# Patient Record
Sex: Male | Born: 1955 | Race: White | Hispanic: No | Marital: Married | Smoking: Never smoker
Health system: Southern US, Community
[De-identification: ages and names within clinical notes are randomized; demographics above are authoritative.]

## PROBLEM LIST (undated history)

## (undated) DIAGNOSIS — Z87442 Personal history of urinary calculi: Secondary | ICD-10-CM

## (undated) DIAGNOSIS — Z95828 Presence of other vascular implants and grafts: Secondary | ICD-10-CM

## (undated) DIAGNOSIS — Z9221 Personal history of antineoplastic chemotherapy: Secondary | ICD-10-CM

## (undated) DIAGNOSIS — C801 Malignant (primary) neoplasm, unspecified: Secondary | ICD-10-CM

## (undated) DIAGNOSIS — Z9289 Personal history of other medical treatment: Secondary | ICD-10-CM

## (undated) DIAGNOSIS — J189 Pneumonia, unspecified organism: Secondary | ICD-10-CM

## (undated) DIAGNOSIS — I499 Cardiac arrhythmia, unspecified: Secondary | ICD-10-CM

## (undated) DIAGNOSIS — G629 Polyneuropathy, unspecified: Secondary | ICD-10-CM

## (undated) DIAGNOSIS — Z8719 Personal history of other diseases of the digestive system: Secondary | ICD-10-CM

## (undated) HISTORY — PX: OTHER SURGICAL HISTORY: SHX169

## (undated) HISTORY — PX: APPENDECTOMY: SHX54

---

## 2017-08-16 ENCOUNTER — Other Ambulatory Visit: Payer: Self-pay | Admitting: Oncology

## 2017-08-16 DIAGNOSIS — C787 Secondary malignant neoplasm of liver and intrahepatic bile duct: Secondary | ICD-10-CM

## 2017-08-23 ENCOUNTER — Ambulatory Visit
Admission: RE | Admit: 2017-08-23 | Discharge: 2017-08-23 | Disposition: A | Discharge status: Home / Self Care | Payer: BLUE CROSS/BLUE SHIELD | Source: Ambulatory Visit | Attending: Oncology | Admitting: Oncology

## 2017-08-23 DIAGNOSIS — C787 Secondary malignant neoplasm of liver and intrahepatic bile duct: Secondary | ICD-10-CM

## 2017-08-23 HISTORY — PX: IR RADIOLOGIST EVAL & MGMT: IMG5224

## 2017-08-23 NOTE — Consult Note (Signed)
Chief Complaint: I have colon cancer  Referring Physician(s): Khan,Kalsoom PCP: Dr. Concepcion Elk  History of Present Illness: Andres Chang is a 61 y.o. male presenting today as a scheduled consultation to Vascular & Interventional Radiology, kindly referred by Dr. Humphrey Rolls, for consideration of liver directed therapy for treatment of known metastatic colorectal carcinoma.    Andres Chang tells me that his diagnosis was made in September of 2016, when he presented to [facility] with rectal bleeding.  In fact, he was referred to VIR at that time, and I helped to perform a mesenteric angiogram with embolization for his emergent, life-threatening hemorrhage.  As it was, he had tumor bleeding, with the diagnosis made thereafter.    He has not had surgical resection.  He has been treated with chemotherapy, initiating his therapy in 2016.    I do not have current access to all of his electronic medical records (recent electronic merger with the new [facility]), but I do have Dr. Laurelyn Sickle recent clinic notes from 08/10/2017.    Dr. Laurelyn Sickle plan is to continue him on chemotherapy of 5-FU/leucovorin/Avastin.   Cycle 38 was initiated 08/10/2017.    PET-CT has been performed first 09/16/2015, then 01/20/2017, showing marked response of the liver disease burden, decreased rectal mass, decreased activity of adjacent nodal disease.   The most recent PET-CT 08/02/2017 shows only 2 residual foci of the liver, both measuring only ~2cm, with no residual disease on the rectum, and no thoracic disease.     I discussed the imaging with Dr. Humphrey Rolls prior to Andres Chang's referral, and discussion regarding possible ablation is reasonable.    Andres Chang tells me that right now he feels fine.  He says that he will have some fatigue a week or so after each of his treatments, but he denies any significant N/V/D.  He is able to do everything he needs to during the day.  His treatment with oxaliplatin resulted in some loss  of sensation of both hands, and he is no longer on this therapy.   He tells me he gets up in the am every day, and although does not spend a lot of time out of door to avoid the sun, he is out of the bed all day around the house he has a good appetite.  I feel that he had ECOG now of 0.    He does have a history of ascites, with several prior paracentesis.  Imaging shows evidence of cirrhosis, with esophageal varices as well.  He has no knowledge of HBV, HCV, NASH, and denies any history of heavy ETOH use.  He has been previously treated with amiodarone, now held.    His last paracentesis is January 2018.    Med Hx: Anemia Arrhythmia/Afib HTN Nephrolithiasis Thrombocytopenia  Social hx: Married  Allergies: Cymbalta [duloxetine hcl]; Gabapentin; and Penicillins  Medications: Prior to Admission medications   Medication Sig Start Date End Date Taking? Authorizing Provider  ALPRAZolam Duanne Moron) 1 MG tablet Take 1 mg by mouth daily as needed for anxiety.    Yes [provider]  B COMPLEX VITAMINS ER PO Take 1 tablet by mouth daily.   Yes [provider]  diltiazem (DILACOR XR) 120 MG 24 hr capsule Take 120 mg by mouth daily.   Yes [provider]  Iron-FA-B Cmp-C-Biot-Probiotic (FUSION PLUS) CAPS Take 1 tablet by mouth daily.   Yes [provider]  megestrol (MEGACE) 400 MG/10ML suspension Take 800 mg by  mouth daily.   Yes [provider]  prochlorperazine (COMPAZINE) 10 MG tablet Take 10 mg by mouth daily.   Yes [provider]  rivaroxaban (XARELTO) 20 MG TABS tablet Take 20 mg by mouth daily with supper.   Yes [provider]  spironolactone (ALDACTONE) 50 MG tablet Take 50 mg by mouth daily.   Yes [provider]     No family history on file.  Social History   Social History  . Marital status: Married    Spouse name: N/A  . Number of children: N/A  . Years of education: N/A   Social History Main Topics    . Smoking status: Not on file  . Smokeless tobacco: Not on file  . Alcohol use Not on file  . Drug use: Unknown  . Sexual activity: Not on file   Other Topics Concern  . Not on file   Social History Narrative  . No narrative on file    ECOG Status: 0 - Asymptomatic  Review of Systems: A 12 point ROS discussed and pertinent positives are indicated in the HPI above.  All other systems are negative.  Review of Systems  Vital Signs: BP 125/73   Pulse 61   Temp 98.1 F (36.7 C) (Oral)   Resp 14   Ht 5\' 10"  (1.778 m)   Wt 206 lb (93.4 kg)   SpO2 97%   BMI 29.56 kg/m   Physical Exam General: 61 yo male appearing  stated age.  Well-developed, well-nourished.  No distress. HEENT: Atraumatic, normocephalic.  Conjugate gaze, extra-ocular motor intact. No scleral icterus or scleral injection. No lesions on external ears, nose, lips, or gums.  Oral mucosa moist, pink.  Neck: Symmetric with no goiter enlargement.  Chest/Lungs:  Symmetric chest with inspiration/expiration.  No labored breathing.     Heart:  RRR, with no third heart sounds appreciated. No JVD appreciated.  Abdomen:  Soft, NT/ND, with + bowel sounds.   Genito-urinary: Deferred Neurologic: Alert & Oriented to person, place, and time.   Normal affect and insight.  Appropriate questions.  Moving all 4 extremities       Mallampati Score:  2  Imaging: No results found.  Labs:  CBC: No results for input(s): WBC, HGB, HCT, PLT in the last 8760 hours.  COAGS: No results for input(s): INR, APTT in the last 8760 hours.  BMP: No results for input(s): NA, K, CL, CO2, GLUCOSE, BUN, CALCIUM, CREATININE, GFRNONAA, GFRAA in the last 8760 hours.  Invalid input(s): CMP  LIVER FUNCTION TESTS: No results for input(s): BILITOT, AST, ALT, ALKPHOS, PROT, ALBUMIN in the last 8760 hours.  TUMOR MARKERS: No results for input(s): AFPTM, CEA, CA199, CHROMGRNA in the last 8760 hours.  Assessment and Plan:  Andres Chang is a 61  year old male with known metastatic colorectal carcinoma, treated without surgical resection, completing 38 cycles of chemotherapy.    He has recent PET CT imaging showing no residual disease of the rectum (primary), no thoracic disease, and no adenopathy.  He only has 2 small residual lesions in the liver, thus oligometastatic disease.  I have discussed his imaging with Dr. Humphrey Rolls, and given the small size, I agree that he is a candidate for possible ablation.  I suspect with his history of ascites he would not be a candidate for surgical resection.    I have reviewed all of the imaging with the patient and his wife, and I had a long discussion regarding the role of  liver directed therapy as a palliative therapy.  Technical success with image-guided ablation is quite high in the setting of small lesions such as his (<4cm), although both are at the dome and near the diaphragm.    I had a thorough discussion regarding the risks/benefits, with specific risks including bleeding, infection, adjacent injury including diaphragm, lung, chest wall, biloma, need for further surgery/procedure including chest tube placement, cardiopulmonary collapse, death.  I answered all of his questions, including post-operative care and recovery time.    He would like to proceed with therapy.    Plan: - Plan for CT guided ablation, microwave ablation, 2 lesion of right liver.  General Anesthesia, with overnight observation.  - We will need to hold any anti-coagulation before his procedure.  - I have advised him to observe his follow up appointments with his physicians.   Electronically Signed: Corrie Chang 08/23/2017, 5:37 PM   I spent a total of  40 Minutes   in face to face in clinical consultation, greater than 50% of which was counseling/coordinating care for metastatic colorectal carcinoma, possible CT guided ablation of liver lesions (2).

## 2017-08-30 ENCOUNTER — Other Ambulatory Visit (HOSPITAL_COMMUNITY): Payer: Self-pay | Admitting: Interventional Radiology

## 2017-08-30 DIAGNOSIS — C787 Secondary malignant neoplasm of liver and intrahepatic bile duct: Secondary | ICD-10-CM

## 2017-09-12 ENCOUNTER — Other Ambulatory Visit: Payer: Self-pay | Admitting: Radiology

## 2017-09-12 NOTE — Patient Instructions (Addendum)
Andres Chang  09/12/2017   Your procedure is scheduled on: 09/16/2017    Report to [facility] Main  Entrance and Report to Radiology at 0630am then  Take [facility]  elevators to 3rd floor to  [place] at   [place] AM.    Call this number if you have problems the morning of surgery [phone]    Remember: ONLY 1 PERSON MAY GO WITH YOU TO SHORT STAY TO GET  READY MORNING OF [place].  Do not eat food or drink liquids :After Midnight.     Take these medicines the morning of surgery with A SIP OF WATER: Cardizem                                 You may not have any metal on your body including hair pins and              piercings  Do not wear jewelry,  lotions, powders or perfumes, deodorant            .              Men may shave face and neck.   Do not bring valuables to the hospital. [place].  Contacts, dentures or bridgework may not be worn into surgery.  Leave suitcase in the car. After surgery it may be brought to your room.                       Please read over the following fact sheets you were given: _____________________________________________________________________             [facility] - Preparing for Surgery Before surgery, you can play an important role.  Because skin is not sterile, your skin needs to be as free of germs as possible.  You can reduce the number of germs on your skin by washing with CHG (chlorahexidine gluconate) soap before surgery.  CHG is an antiseptic cleaner which kills germs and bonds with the skin to continue killing germs even after washing. Please DO NOT use if you have an allergy to CHG or antibacterial soaps.  If your skin becomes reddened/irritated stop using the CHG and inform your nurse when you arrive at Short Stay. Do not shave (including legs and underarms) for at least 48 hours prior to the first CHG shower.  You may shave your face/neck. Please  follow these instructions carefully:  1.  Shower with CHG Soap the night before surgery and the  morning of Surgery.  2.  If you choose to wash your hair, wash your hair first as usual with your  normal  shampoo.  3.  After you shampoo, rinse your hair and body thoroughly to remove the  shampoo.                           4.  Use CHG as you would any other liquid soap.  You can apply chg directly  to the skin and wash                       Gently with a scrungie or clean washcloth.  5.  Apply the CHG Soap to your body ONLY FROM THE NECK DOWN.   Do not use on face/ open                           Wound or open sores. Avoid contact with eyes, ears mouth and genitals (private parts).                       Wash face,  Genitals (private parts) with your normal soap.             6.  Wash thoroughly, paying special attention to the area where your surgery  will be performed.  7.  Thoroughly rinse your body with warm water from the neck down.  8.  DO NOT shower/wash with your normal soap after using and rinsing off  the CHG Soap.                9.  Pat yourself dry with a clean towel.            10.  Wear clean pajamas.            11.  Place clean sheets on your bed the night of your first shower and do not  sleep with pets. Day of Surgery : Do not apply any lotions/deodorants the morning of surgery.  Please wear clean clothes to the hospital/surgery center.  FAILURE TO FOLLOW THESE INSTRUCTIONS MAY RESULT IN THE CANCELLATION OF YOUR SURGERY PATIENT SIGNATURE_________________________________  NURSE SIGNATURE__________________________________  ________________________________________________________________________

## 2017-09-13 ENCOUNTER — Ambulatory Visit (HOSPITAL_COMMUNITY)
Admission: RE | Admit: 2017-09-13 | Discharge: 2017-09-13 | Disposition: A | Discharge status: Home / Self Care | Payer: BLUE CROSS/BLUE SHIELD | Source: Ambulatory Visit | Attending: Radiology | Admitting: Radiology

## 2017-09-13 ENCOUNTER — Other Ambulatory Visit: Payer: Self-pay | Admitting: Radiology

## 2017-09-13 ENCOUNTER — Encounter (HOSPITAL_COMMUNITY): Payer: Self-pay

## 2017-09-13 ENCOUNTER — Encounter (HOSPITAL_COMMUNITY)
Admission: RE | Admit: 2017-09-13 | Discharge: 2017-09-13 | Disposition: A | Discharge status: Home / Self Care | Payer: BLUE CROSS/BLUE SHIELD | Source: Ambulatory Visit | Attending: Interventional Radiology | Admitting: Interventional Radiology

## 2017-09-13 DIAGNOSIS — Z01818 Encounter for other preprocedural examination: Secondary | ICD-10-CM | POA: Diagnosis not present

## 2017-09-13 DIAGNOSIS — C189 Malignant neoplasm of colon, unspecified: Secondary | ICD-10-CM | POA: Insufficient documentation

## 2017-09-13 DIAGNOSIS — C787 Secondary malignant neoplasm of liver and intrahepatic bile duct: Secondary | ICD-10-CM | POA: Diagnosis not present

## 2017-09-13 DIAGNOSIS — Z0183 Encounter for blood typing: Secondary | ICD-10-CM | POA: Insufficient documentation

## 2017-09-13 DIAGNOSIS — I491 Atrial premature depolarization: Secondary | ICD-10-CM | POA: Diagnosis not present

## 2017-09-13 DIAGNOSIS — Z01812 Encounter for preprocedural laboratory examination: Secondary | ICD-10-CM | POA: Insufficient documentation

## 2017-09-13 HISTORY — DX: Cardiac arrhythmia, unspecified: I49.9

## 2017-09-13 HISTORY — DX: Pneumonia, unspecified organism: J18.9

## 2017-09-13 HISTORY — DX: Personal history of antineoplastic chemotherapy: Z92.21

## 2017-09-13 HISTORY — DX: Personal history of urinary calculi: Z87.442

## 2017-09-13 HISTORY — DX: Presence of other vascular implants and grafts: Z95.828

## 2017-09-13 HISTORY — DX: Personal history of other diseases of the digestive system: Z87.19

## 2017-09-13 HISTORY — DX: Polyneuropathy, unspecified: G62.9

## 2017-09-13 HISTORY — DX: Personal history of other medical treatment: Z92.89

## 2017-09-13 HISTORY — DX: Malignant (primary) neoplasm, unspecified: C80.1

## 2017-09-13 LAB — CBC WITH DIFFERENTIAL/PLATELET
Basophils Absolute: 0 10*3/uL (ref 0.0–0.1)
Basophils Relative: 0 %
EOS PCT: 6 %
Eosinophils Absolute: 0.4 10*3/uL (ref 0.0–0.7)
HCT: 39.6 % (ref 39.0–52.0)
HEMOGLOBIN: 13.7 g/dL (ref 13.0–17.0)
Lymphocytes Relative: 13 %
Lymphs Abs: 0.9 10*3/uL (ref 0.7–4.0)
MCH: 32.7 pg (ref 26.0–34.0)
MCHC: 34.6 g/dL (ref 30.0–36.0)
MCV: 94.5 fL (ref 78.0–100.0)
Monocytes Absolute: 1 10*3/uL (ref 0.1–1.0)
Monocytes Relative: 15 %
NEUTROS PCT: 66 %
Neutro Abs: 4.4 10*3/uL (ref 1.7–7.7)
Platelets: 72 10*3/uL — ABNORMAL LOW (ref 150–400)
RBC: 4.19 MIL/uL — AB (ref 4.22–5.81)
RDW: 15.9 % — ABNORMAL HIGH (ref 11.5–15.5)
WBC: 6.7 10*3/uL (ref 4.0–10.5)

## 2017-09-13 LAB — COMPREHENSIVE METABOLIC PANEL
ALT: 39 U/L (ref 17–63)
AST: 50 U/L — AB (ref 15–41)
Albumin: 3.6 g/dL (ref 3.5–5.0)
Alkaline Phosphatase: 72 U/L (ref 38–126)
Anion gap: 8 (ref 5–15)
BILIRUBIN TOTAL: 1.5 mg/dL — AB (ref 0.3–1.2)
BUN: 11 mg/dL (ref 6–20)
CHLORIDE: 109 mmol/L (ref 101–111)
CO2: 24 mmol/L (ref 22–32)
CREATININE: 1.01 mg/dL (ref 0.61–1.24)
Calcium: 9.7 mg/dL (ref 8.9–10.3)
GFR calc Af Amer: >60 mL/min (ref >60)
Glucose, Bld: 103 mg/dL — ABNORMAL HIGH (ref 65–99)
Potassium: 4.1 mmol/L (ref 3.5–5.1)
Sodium: 141 mmol/L (ref 135–145)
TOTAL PROTEIN: 6.3 g/dL — AB (ref 6.5–8.1)

## 2017-09-13 LAB — PROTIME-INR
INR: 1.68
PROTHROMBIN TIME: 19.7 s — AB (ref 11.4–15.2)

## 2017-09-13 NOTE — Progress Notes (Signed)
CBC done 09/13/2017 routed via epic to Dr Earleen Newport.

## 2017-09-13 NOTE — Progress Notes (Signed)
AT time of preop appt patient and wife thought last dose of Manus Rudd would be on 09/14/17.  Both were going home to check to make sure they had the preop instructions regarding Xarelto. Wife called back to state they had the preop instructions and the last dose of Xarelto is on 09/14/17 per instructions.

## 2017-09-13 NOTE — Progress Notes (Signed)
Spoke with Rowe Robert PA and made him aware of platelet count of 72 on CBC result of 09/13/17 and patient's last dose of Xarelton on 09/14/17 per patient and that Anesthesia Dr Fransisco Beau had seen him today on preop appt.

## 2017-09-13 NOTE — Progress Notes (Signed)
Requested most recent office visit note, ekg , stress and echo from Dr Minna Merritts office in [place].  They are to fax.

## 2017-09-13 NOTE — Progress Notes (Signed)
PT/INR done 09/13/17 routed via epic to dr Earleen Newport.

## 2017-09-13 NOTE — Progress Notes (Signed)
Anesthesia consult completed by Dr Fransisco Beau .

## 2017-09-14 LAB — ABO/RH: ABO/RH(D): A POS

## 2017-09-14 NOTE — Progress Notes (Signed)
Received and placed on chart from office of Dr Minna Merritts- office visit note of 05/17/17 Seen by PA- Chesley Mires and ECHO from 01/14/2017 from [facility] placed on chart.

## 2017-09-14 NOTE — Progress Notes (Signed)
Reerquested LOV, ekg , stress and echo from office of Dr Minna Merritts.  They are to send.  Rerequested on 09/14/17.

## 2017-09-14 NOTE — Progress Notes (Signed)
Final EKG done 09/13/17-epic.

## 2017-09-15 ENCOUNTER — Other Ambulatory Visit: Payer: Self-pay | Admitting: Radiology

## 2017-09-16 ENCOUNTER — Ambulatory Visit (HOSPITAL_COMMUNITY): Payer: BLUE CROSS/BLUE SHIELD | Admitting: Anesthesiology

## 2017-09-16 ENCOUNTER — Ambulatory Visit (HOSPITAL_COMMUNITY)
Admission: RE | Admit: 2017-09-16 | Discharge: 2017-09-16 | Disposition: A | Discharge status: Home / Self Care | Payer: BLUE CROSS/BLUE SHIELD | Source: Ambulatory Visit | Attending: Interventional Radiology | Admitting: Interventional Radiology

## 2017-09-16 ENCOUNTER — Encounter (HOSPITAL_COMMUNITY): Payer: Self-pay

## 2017-09-16 ENCOUNTER — Encounter (HOSPITAL_COMMUNITY)
Admission: RE | Disposition: A | Discharge status: Home / Self Care | Payer: Self-pay | Source: Ambulatory Visit | Attending: Interventional Radiology

## 2017-09-16 ENCOUNTER — Observation Stay (HOSPITAL_COMMUNITY)
Admission: RE | Admit: 2017-09-16 | Discharge: 2017-09-17 | Disposition: A | Discharge status: Home / Self Care | Payer: BLUE CROSS/BLUE SHIELD | Source: Ambulatory Visit | Attending: Interventional Radiology | Admitting: Interventional Radiology

## 2017-09-16 ENCOUNTER — Encounter (HOSPITAL_COMMUNITY): Payer: Self-pay | Admitting: Anesthesiology

## 2017-09-16 ENCOUNTER — Other Ambulatory Visit: Payer: Self-pay

## 2017-09-16 DIAGNOSIS — C787 Secondary malignant neoplasm of liver and intrahepatic bile duct: Secondary | ICD-10-CM

## 2017-09-16 DIAGNOSIS — Z79899 Other long term (current) drug therapy: Secondary | ICD-10-CM | POA: Diagnosis not present

## 2017-09-16 DIAGNOSIS — Z89022 Acquired absence of left finger(s): Secondary | ICD-10-CM | POA: Insufficient documentation

## 2017-09-16 DIAGNOSIS — Z7901 Long term (current) use of anticoagulants: Secondary | ICD-10-CM | POA: Insufficient documentation

## 2017-09-16 DIAGNOSIS — Z9221 Personal history of antineoplastic chemotherapy: Secondary | ICD-10-CM | POA: Insufficient documentation

## 2017-09-16 DIAGNOSIS — C189 Malignant neoplasm of colon, unspecified: Secondary | ICD-10-CM | POA: Diagnosis present

## 2017-09-16 DIAGNOSIS — K7460 Unspecified cirrhosis of liver: Secondary | ICD-10-CM | POA: Diagnosis not present

## 2017-09-16 DIAGNOSIS — I4891 Unspecified atrial fibrillation: Secondary | ICD-10-CM | POA: Insufficient documentation

## 2017-09-16 DIAGNOSIS — I10 Essential (primary) hypertension: Secondary | ICD-10-CM | POA: Insufficient documentation

## 2017-09-16 HISTORY — PX: RADIOLOGY WITH ANESTHESIA: SHX6223

## 2017-09-16 LAB — CBC WITH DIFFERENTIAL/PLATELET
Basophils Absolute: 0 10*3/uL (ref 0.0–0.1)
Basophils Relative: 0 %
EOS ABS: 0.5 10*3/uL (ref 0.0–0.7)
EOS PCT: 7 %
HCT: 39.6 % (ref 39.0–52.0)
Hemoglobin: 13.7 g/dL (ref 13.0–17.0)
LYMPHS ABS: 1.1 10*3/uL (ref 0.7–4.0)
Lymphocytes Relative: 16 %
MCH: 33.3 pg (ref 26.0–34.0)
MCHC: 34.6 g/dL (ref 30.0–36.0)
MCV: 96.4 fL (ref 78.0–100.0)
MONO ABS: 0.9 10*3/uL (ref 0.1–1.0)
MONOS PCT: 14 %
Neutro Abs: 4.2 10*3/uL (ref 1.7–7.7)
Neutrophils Relative %: 63 %
PLATELETS: 69 10*3/uL — AB (ref 150–400)
RBC: 4.11 MIL/uL — ABNORMAL LOW (ref 4.22–5.81)
RDW: 16.2 % — ABNORMAL HIGH (ref 11.5–15.5)
WBC: 6.7 10*3/uL (ref 4.0–10.5)

## 2017-09-16 LAB — COMPREHENSIVE METABOLIC PANEL
ALK PHOS: 71 U/L (ref 38–126)
ALT: 41 U/L (ref 17–63)
AST: 47 U/L — ABNORMAL HIGH (ref 15–41)
Albumin: 3.5 g/dL (ref 3.5–5.0)
Anion gap: 9 (ref 5–15)
BUN: 12 mg/dL (ref 6–20)
CALCIUM: 9.3 mg/dL (ref 8.9–10.3)
CHLORIDE: 107 mmol/L (ref 101–111)
CO2: 22 mmol/L (ref 22–32)
Creatinine, Ser: 0.93 mg/dL (ref 0.61–1.24)
GFR calc non Af Amer: >60 mL/min (ref >60)
Glucose, Bld: 101 mg/dL — ABNORMAL HIGH (ref 65–99)
Potassium: 3.8 mmol/L (ref 3.5–5.1)
SODIUM: 138 mmol/L (ref 135–145)
Total Bilirubin: 1.7 mg/dL — ABNORMAL HIGH (ref 0.3–1.2)
Total Protein: 6.2 g/dL — ABNORMAL LOW (ref 6.5–8.1)

## 2017-09-16 LAB — PROTIME-INR
INR: 1.11
Prothrombin Time: 14.2 seconds (ref 11.4–15.2)

## 2017-09-16 SURGERY — RADIOLOGY WITH ANESTHESIA
Anesthesia: General

## 2017-09-16 MED ORDER — IOPAMIDOL (ISOVUE-300) INJECTION 61%
75.0000 mL | Freq: Once | INTRAVENOUS | Status: AC | PRN
  Administered 2017-09-16: 75 mL via INTRAVENOUS

## 2017-09-16 MED ORDER — FENTANYL CITRATE (PF) 250 MCG/5ML IJ SOLN
INTRAMUSCULAR | Status: AC
  Filled 2017-09-16: qty 5

## 2017-09-16 MED ORDER — PROPOFOL 10 MG/ML IV BOLUS
INTRAVENOUS | Status: AC
  Filled 2017-09-16: qty 40

## 2017-09-16 MED ORDER — MEGESTROL ACETATE 400 MG/10ML PO SUSP: 800.0000 mg | Freq: Every day | ORAL | Status: DC | PRN

## 2017-09-16 MED ORDER — FENTANYL CITRATE (PF) 100 MCG/2ML IJ SOLN
INTRAMUSCULAR | Status: DC | PRN
  Administered 2017-09-16 (×3): 50 ug via INTRAVENOUS
  Administered 2017-09-16: 100 ug via INTRAVENOUS
  Administered 2017-09-16 (×2): 50 ug via INTRAVENOUS

## 2017-09-16 MED ORDER — SENNOSIDES-DOCUSATE SODIUM 8.6-50 MG PO TABS: 1.0000 | ORAL_TABLET | Freq: Every day | ORAL | Status: DC | PRN

## 2017-09-16 MED ORDER — DOCUSATE SODIUM 100 MG PO CAPS
100.0000 mg | ORAL_CAPSULE | Freq: Two times a day (BID) | ORAL | Status: DC
  Administered 2017-09-16 – 2017-09-17 (×2): 100 mg via ORAL
  Filled 2017-09-16 (×3): qty 1

## 2017-09-16 MED ORDER — RIVAROXABAN 20 MG PO TABS
20.0000 mg | ORAL_TABLET | Freq: Every day | ORAL | Status: DC
  Administered 2017-09-17: 20 mg via ORAL
  Filled 2017-09-16: qty 1

## 2017-09-16 MED ORDER — MIDAZOLAM HCL 2 MG/2ML IJ SOLN
INTRAMUSCULAR | Status: AC
  Filled 2017-09-16: qty 2

## 2017-09-16 MED ORDER — METRONIDAZOLE IN NACL 5-0.79 MG/ML-% IV SOLN
500.0000 mg | Freq: Once | INTRAVENOUS | Status: AC
  Administered 2017-09-16: 500 mg via INTRAVENOUS
  Filled 2017-09-16: qty 100

## 2017-09-16 MED ORDER — FENTANYL CITRATE (PF) 100 MCG/2ML IJ SOLN: 25.0000 ug | INTRAMUSCULAR | Status: DC | PRN

## 2017-09-16 MED ORDER — ONDANSETRON HCL 4 MG/2ML IJ SOLN
INTRAMUSCULAR | Status: AC
  Filled 2017-09-16: qty 2

## 2017-09-16 MED ORDER — DEXTROSE 5 % IV SOLN
2.0000 g | Freq: Once | INTRAVENOUS | Status: AC
  Administered 2017-09-16: 2 g via INTRAVENOUS
  Filled 2017-09-16: qty 2

## 2017-09-16 MED ORDER — ONDANSETRON HCL 4 MG/2ML IJ SOLN
INTRAMUSCULAR | Status: DC | PRN
  Administered 2017-09-16: 4 mg via INTRAVENOUS

## 2017-09-16 MED ORDER — DILTIAZEM HCL ER COATED BEADS 120 MG PO CP24
120.0000 mg | ORAL_CAPSULE | Freq: Every day | ORAL | Status: DC
  Administered 2017-09-17: 120 mg via ORAL
  Filled 2017-09-16: qty 1

## 2017-09-16 MED ORDER — SODIUM CHLORIDE 0.9 % IV SOLN
Freq: Once | INTRAVENOUS | Status: AC
  Administered 2017-09-16: 13:00:00 via INTRAVENOUS

## 2017-09-16 MED ORDER — IOPAMIDOL (ISOVUE-300) INJECTION 61%
INTRAVENOUS | Status: AC
  Administered 2017-09-16: 75 mL via INTRAVENOUS
  Filled 2017-09-16: qty 75

## 2017-09-16 MED ORDER — SODIUM CHLORIDE 0.9 % IV SOLN
INTRAVENOUS | Status: AC
  Filled 2017-09-16: qty 250

## 2017-09-16 MED ORDER — EPHEDRINE SULFATE-NACL 50-0.9 MG/10ML-% IV SOSY
PREFILLED_SYRINGE | INTRAVENOUS | Status: DC | PRN
  Administered 2017-09-16 (×2): 5 mg via INTRAVENOUS
  Administered 2017-09-16: 10 mg via INTRAVENOUS

## 2017-09-16 MED ORDER — MIDAZOLAM HCL 5 MG/5ML IJ SOLN
INTRAMUSCULAR | Status: DC | PRN
  Administered 2017-09-16: 2 mg via INTRAVENOUS

## 2017-09-16 MED ORDER — LACTATED RINGERS IV SOLN
INTRAVENOUS | Status: DC
  Administered 2017-09-16 (×3): via INTRAVENOUS

## 2017-09-16 MED ORDER — IOPAMIDOL (ISOVUE-300) INJECTION 61%
INTRAVENOUS | Status: AC
  Filled 2017-09-16: qty 30

## 2017-09-16 MED ORDER — FENTANYL CITRATE (PF) 100 MCG/2ML IJ SOLN
INTRAMUSCULAR | Status: AC
  Filled 2017-09-16: qty 2

## 2017-09-16 MED ORDER — DEXAMETHASONE SODIUM PHOSPHATE 10 MG/ML IJ SOLN
INTRAMUSCULAR | Status: AC
  Filled 2017-09-16: qty 1

## 2017-09-16 MED ORDER — SPIRONOLACTONE 25 MG PO TABS
50.0000 mg | ORAL_TABLET | Freq: Every day | ORAL | Status: DC
  Administered 2017-09-17: 50 mg via ORAL
  Filled 2017-09-16 (×2): qty 2

## 2017-09-16 MED ORDER — DEXAMETHASONE SODIUM PHOSPHATE 10 MG/ML IJ SOLN
INTRAMUSCULAR | Status: DC | PRN
  Administered 2017-09-16: 10 mg via INTRAVENOUS

## 2017-09-16 MED ORDER — LIDOCAINE 2% (20 MG/ML) 5 ML SYRINGE
INTRAMUSCULAR | Status: DC | PRN
  Administered 2017-09-16: 40 mg via INTRAVENOUS
  Administered 2017-09-16: 60 mg via INTRAVENOUS

## 2017-09-16 MED ORDER — HYDROCODONE-ACETAMINOPHEN 5-325 MG PO TABS: 1.0000 | ORAL_TABLET | ORAL | Status: DC | PRN

## 2017-09-16 MED ORDER — SODIUM CHLORIDE 0.9 % IV SOLN
INTRAVENOUS | Status: AC
  Administered 2017-09-16: 13:00:00 via INTRAVENOUS

## 2017-09-16 MED ORDER — PROPOFOL 10 MG/ML IV BOLUS
INTRAVENOUS | Status: DC | PRN
  Administered 2017-09-16: 170 mg via INTRAVENOUS

## 2017-09-16 MED ORDER — SUGAMMADEX SODIUM 200 MG/2ML IV SOLN
INTRAVENOUS | Status: DC | PRN
Start: 2017-09-16 — End: 2017-09-16
  Administered 2017-09-16: 200 mg via INTRAVENOUS

## 2017-09-16 MED ORDER — ONDANSETRON HCL 4 MG/2ML IJ SOLN: 4.0000 mg | Freq: Four times a day (QID) | INTRAMUSCULAR | Status: DC | PRN

## 2017-09-16 MED ORDER — ROCURONIUM BROMIDE 10 MG/ML (PF) SYRINGE
PREFILLED_SYRINGE | INTRAVENOUS | Status: DC | PRN
  Administered 2017-09-16: 50 mg via INTRAVENOUS

## 2017-09-16 MED ORDER — ALPRAZOLAM 0.5 MG PO TABS
0.5000 mg | ORAL_TABLET | Freq: Every day | ORAL | Status: DC
Start: 2017-09-16 — End: 2017-09-17
  Administered 2017-09-16: 0.5 mg via ORAL
  Filled 2017-09-16: qty 1

## 2017-09-16 NOTE — Transfer of Care (Signed)
Immediate Anesthesia Transfer of Care Note  Patient: Andres Chang  Procedure(s) Performed: Procedure(s): CT - MICROWAVE ABLATION LIVER (N/A)  Patient Location: PACU  Anesthesia Type:General  Level of Consciousness:  sedated, patient cooperative and responds to stimulation  Airway & Oxygen Therapy:Patient Spontanous Breathing and Patient connected to face mask oxgen  Post-op Assessment:  Report given to PACU RN and Post -op Vital signs reviewed and stable  Post vital signs:  Reviewed and stable  Last Vitals:  Vitals:   09/16/17 0654  BP: (!) 151/81  Pulse: 71  Resp: 16  Temp: 36.6 C  SpO2: 01%    Complications: No apparent anesthesia complications

## 2017-09-16 NOTE — Anesthesia Postprocedure Evaluation (Signed)
Anesthesia Post Note  Patient: Andres Chang  Procedure(s) Performed: Procedure(s) (LRB): CT - MICROWAVE ABLATION LIVER (N/A)     Patient location during evaluation: PACU Anesthesia Type: General Level of consciousness: awake and alert Pain management: pain level controlled Vital Signs Assessment: post-procedure vital signs reviewed and stable Respiratory status: spontaneous breathing, nonlabored ventilation and respiratory function stable Cardiovascular status: blood pressure returned to baseline and stable Postop Assessment: no apparent nausea or vomiting Anesthetic complications: no    Last Vitals:  Vitals:   09/16/17 1325 09/16/17 1330  BP: (!) (P) 144/81 136/73  Pulse: (P) 67 69  Resp: (P) 20 20  Temp: (!) 36.4 C   SpO2: (P) 100% 97%    Last Pain:  Vitals:   09/16/17 1245  TempSrc: Oral  PainSc:                  [name]

## 2017-09-16 NOTE — Anesthesia Preprocedure Evaluation (Addendum)
Anesthesia Evaluation  Patient identified by MRN, date of birth, ID band Patient awake    Reviewed: Allergy & Precautions, NPO status , Patient's Chart, lab work & pertinent test results  Airway Mallampati: II  TM Distance: >3 FB Neck ROM: Full    Dental  (+) Dental Advisory Given   Pulmonary neg pulmonary ROS,    breath sounds clear to auscultation       Cardiovascular + dysrhythmias Atrial Fibrillation  Rhythm:Regular Rate:Normal     Neuro/Psych negative neurological ROS     GI/Hepatic negative GI ROS, Colon ca mets to liver    Endo/Other  negative endocrine ROS  Renal/GU negative Renal ROS     Musculoskeletal   Abdominal   Peds  Hematology  (+) Blood dyscrasia (Thrombocytopenia), ,   Anesthesia Other Findings   Reproductive/Obstetrics                            Lab Results  Component Value Date   WBC 6.7 09/13/2017   HGB 13.7 09/13/2017   HCT 39.6 09/13/2017   MCV 94.5 09/13/2017   PLT 72 (L) 09/13/2017   Lab Results  Component Value Date   CREATININE 1.01 09/13/2017   BUN 11 09/13/2017   NA 141 09/13/2017   K 4.1 09/13/2017   CL 109 09/13/2017   CO2 24 09/13/2017    Anesthesia Physical Anesthesia Plan  ASA: III  Anesthesia Plan: General   Post-op Pain Management:    Induction: Intravenous  PONV Risk Score and Plan: 2 and Ondansetron, Dexamethasone and Treatment may vary due to age or medical condition  Airway Management Planned: Oral ETT  Additional Equipment:   Intra-op Plan:   Post-operative Plan: Extubation in OR  Informed Consent: I have reviewed the patients History and Physical, chart, labs and discussed the procedure including the risks, benefits and alternatives for the proposed anesthesia with the patient or authorized representative who has indicated his/her understanding and acceptance.   Dental advisory given  Plan Discussed with:  CRNA  Anesthesia Plan Comments:        Anesthesia Quick Evaluation

## 2017-09-16 NOTE — Progress Notes (Signed)
Patient ID: Andres Chang, male   DOB: 12-12-1956, 61 y.o.   MRN: [id number] Patient currently without significant complaints;wanting something to eat. Vital signs stable, afebrile Puncture sites right upper quadrant abdomen soft, clean, dry, not significantly tender; abdomen soft, positive bowel sounds; foley in place draining yellow urine A/P: metastatic colon cancer to the liver, status post CT-guided microwave ablation of 2 foci in the right hepatic lobe earlier today; for overnight observation; check a.m. labs, advance diet as tolerated;d/c foley after 6 hr bedrest period, ; follow-up in IR clinic with Dr. Earleen Newport in 3-4 weeks  Rowe Robert, [facility]

## 2017-09-16 NOTE — Procedures (Signed)
Interventional Radiology Procedure Note  Procedure:  Image guided microwave ablation of 2 metastatic foci colorectal carcinoma of the right liver.   2 x 17g 15cm PR probes Neuwave, 35KTGYBWL @ 89HTDSK  Complications: None  EBL: none  Specimen: none  Operator: Dr. Earleen Newport, VIR  Recommendations:  - to PACU now for recovery - Overnight admission for observation - advance diet as tolerated - Do not submerge for 7 days - Routine wound care - for local discomfort, ice pack may be useful - prn analgesics overnight - Foley DC at [place] will follow, anticipate DC in am when goals met  Signed,   Dulcy Fanny. Earleen Newport, DO

## 2017-09-16 NOTE — H&P (Signed)
Referring Physician(s): Khan,K  Supervising Physician: Corrie Mckusick  Patient Status:  [facility] OP TBA  Chief Complaint:  Metastatic colon cancer to liver  Subjective: Patient familiar to IR service from recent consultation with Dr. Earleen Newport on 08/23/17 to discuss treatment options for metastatic colon cancer to the liver as well as prior mesenteric arteriogram with embolization in 2016. He has had prior chemotherapy but no surgical resection. Past medical history also significant for cirrhosis, ascites, atrial fibrillation, hypertension, nephrolithiasis, anemia/thrombocytopenia. Prior imaging has revealed 2 lesions less than 2 cm in the right hepatic lobe.Following consultation the patient was deemed an appropriate candidate for CT-guided microwave ablation of the liver lesions and presents today for the procedure.he currently denies fever, headache, chest pain, dyspnea, cough, abdominal/back pain, nausea, vomiting or abnormal bleeding. Past Medical History:  Diagnosis Date  . Cancer ([place])    colon ca mets to liver   . Dysrhythmia    atrial fib   . History of blood transfusion   . History of chemotherapy   . History of GI bleed   . History of kidney stones   . Neuropathy   . Pneumonia    hx of   . Port catheter in place    Past Surgical History:  Procedure Laterality Date  . APPENDECTOMY    . kidney stent      due to kidney stones   . left fifth finger amputation     . port a cath placement    . surgery related to GI bleed         Allergies: Cymbalta [duloxetine hcl]; Gabapentin; and Penicillins  Medications: Prior to Admission medications   Medication Sig Start Date End Date Taking? Authorizing Provider  ALPRAZolam Duanne Moron) 1 MG tablet Take 0.5 mg by mouth at bedtime.    Yes [provider]  b complex vitamins tablet Take 1 tablet by mouth daily.   Yes [provider]  diltiazem (CARDIZEM CD) 120 MG 24 hr capsule Take 120 mg by mouth daily.   Yes  [provider]  Emollient (AQUAPHOR EX) Apply 1 application topically daily as needed (for bruised skin.).   Yes [provider]  Iron-FA-B Cmp-C-Biot-Probiotic (FUSION PLUS) CAPS Take 1 tablet by mouth daily.   Yes [provider]  prochlorperazine (COMPAZINE) 10 MG tablet Take 10 mg by mouth every 4 (four) hours as needed (for nausea/vomiting associated with chemo).    Yes [provider]  rivaroxaban (XARELTO) 20 MG TABS tablet Take 20 mg by mouth daily with breakfast.    Yes [provider]  spironolactone (ALDACTONE) 50 MG tablet Take 50 mg by mouth daily.   Yes [provider]  megestrol (MEGACE) 400 MG/10ML suspension Take 800 mg by mouth daily as needed (for appetite).     [provider]     Vital Signs: BP (!) 151/81   Pulse 71   Temp 97.8 F (36.6 C) (Oral)   Resp 16   SpO2 94%   Physical Exam: awake, alert. Chest clear to auscultation bilaterally. Clean, intact right chest wall Port-A-Cath. Heart with normal rate, irregular rhythm. Abdomen soft, positive bowel sounds, nontender. No lower extremity edema.  Imaging: Dg Chest 1 View  Result Date: 09/13/2017 CLINICAL DATA:  Preoperative evaluation for upcoming liver ablation EXAM: CHEST 1 VIEW COMPARISON:  None. FINDINGS: Cardiac shadow is within normal limits. The lungs are well aerated without focal infiltrate. Right chest wall port is noted in the mid superior vena cava. No  other focal abnormality is seen. IMPRESSION: No active disease. Electronically Signed   By: Inez Catalina M.D.   On: 09/13/2017 15:17    Labs:  CBC:  Recent Labs  09/13/17 1332 09/16/17 0706  WBC 6.7 6.7  HGB 13.7 13.7  HCT 39.6 39.6  PLT 72* 69*    COAGS:  Recent Labs  09/13/17 1332 09/16/17 0706  INR 1.68 1.11    BMP:  Recent Labs  09/13/17 1332 09/16/17 0706  NA 141 138  K 4.1 3.8  CL 109 107  CO2 24 22  GLUCOSE 103* 101*  BUN 11 12  CALCIUM 9.7 9.3    CREATININE 1.01 0.93  GFRNONAA >60 >60  GFRAA >60 >60    LIVER FUNCTION TESTS:  Recent Labs  09/13/17 1332 09/16/17 0706  BILITOT 1.5* 1.7*  AST 50* 47*  ALT 39 41  ALKPHOS 72 71  PROT 6.3* 6.2*  ALBUMIN 3.6 3.5    Assessment and Plan: Patient with history of metastatic colon cancer to the liver, initially diagnosed in 2016; status post chemotherapy. Prior imaging has revealed 2 right hepatic lobe lesions less than 2 cm. Patient seen in consultation by Dr. Earleen Newport on 08/23/17 and deemed an appropriate candidate for CT-guided microwave ablation of the liver lesions. He presents today for the procedure. Plan is to obtain follow-up CT scan today and if there has been no significant changes proceed with microwave ablation of the liver lesions.Details/risks of procedure, including but not limited to internal bleeding, infection, injury to adjacent structures including diaphragm, lung, chest wall, biloma, need for further surgery/procedure including chest tube placement, cardio pulmonary collapse and death discussed with patient and wife with their understanding and consent. Post procedure he will be admitted to the hospital for overnight observation.    Electronically Signed: D. Rowe Robert, PA-C 09/16/2017, 8:25 AM   I spent a total of 30 minutes at the the patient's bedside AND on the patient's hospital floor or unit, greater than 50% of which was counseling/coordinating care for CT-guided microwave ablation of hepatic lesions from metastatic colorectal cancer

## 2017-09-16 NOTE — Sedation Documentation (Signed)
Anesthesia in to sedate and monitor patient,

## 2017-09-16 NOTE — Anesthesia Procedure Notes (Signed)
Procedure Name: Intubation Date/Time: 09/16/2017 8:54 AM Performed by: Lyndle Pang, Virgel Gess Pre-anesthesia Checklist: Patient identified, Emergency Drugs available, Suction available, Patient being monitored and Timeout performed Patient Re-evaluated:Patient Re-evaluated prior to induction Oxygen Delivery Method: Circle system utilized Preoxygenation: Pre-oxygenation with 100% oxygen Induction Type: IV induction Ventilation: Mask ventilation without difficulty Laryngoscope Size: Mac and 4 Grade View: Grade I Tube type: Oral Tube size: 7.5 mm Number of attempts: 1 Airway Equipment and Method: Stylet Placement Confirmation: ETT inserted through vocal cords under direct vision,  positive ETCO2,  CO2 detector and breath sounds checked- equal and bilateral Secured at: 22 cm Tube secured with: Tape Dental Injury: Teeth and Oropharynx as per pre-operative assessment

## 2017-09-17 DIAGNOSIS — C189 Malignant neoplasm of colon, unspecified: Secondary | ICD-10-CM | POA: Diagnosis not present

## 2017-09-17 LAB — PREPARE PLATELET PHERESIS
UNIT DIVISION: 0
UNIT DIVISION: 0
UNIT DIVISION: 0

## 2017-09-17 LAB — CBC WITH DIFFERENTIAL/PLATELET
BASOS ABS: 0 10*3/uL (ref 0.0–0.1)
Basophils Relative: 0 %
Eosinophils Absolute: 0 10*3/uL (ref 0.0–0.7)
Eosinophils Relative: 0 %
HEMATOCRIT: 34.1 % — AB (ref 39.0–52.0)
HEMOGLOBIN: 11.6 g/dL — AB (ref 13.0–17.0)
LYMPHS PCT: 4 %
Lymphs Abs: 0.3 10*3/uL — ABNORMAL LOW (ref 0.7–4.0)
MCH: 33 pg (ref 26.0–34.0)
MCHC: 34 g/dL (ref 30.0–36.0)
MCV: 96.9 fL (ref 78.0–100.0)
Monocytes Absolute: 0.7 10*3/uL (ref 0.1–1.0)
Monocytes Relative: 11 %
NEUTROS ABS: 5.6 10*3/uL (ref 1.7–7.7)
NEUTROS PCT: 85 %
Platelets: 87 10*3/uL — ABNORMAL LOW (ref 150–400)
RBC: 3.52 MIL/uL — AB (ref 4.22–5.81)
RDW: 16.1 % — ABNORMAL HIGH (ref 11.5–15.5)
WBC: 6.6 10*3/uL (ref 4.0–10.5)

## 2017-09-17 LAB — BPAM PLATELET PHERESIS
BLOOD PRODUCT EXPIRATION DATE: 201809292359
BLOOD PRODUCT EXPIRATION DATE: 201809302359
Blood Product Expiration Date: 201809302359
ISSUE DATE / TIME: 201809281312
ISSUE DATE / TIME: 201809281215
ISSUE DATE / TIME: 201809281300
Unit Type and Rh: 6200
Unit Type and Rh: 6200
Unit Type and Rh: 6200

## 2017-09-17 LAB — COMPREHENSIVE METABOLIC PANEL
ALK PHOS: 65 U/L (ref 38–126)
ALT: 146 U/L — AB (ref 17–63)
AST: 211 U/L — AB (ref 15–41)
Albumin: 3.2 g/dL — ABNORMAL LOW (ref 3.5–5.0)
Anion gap: 8 (ref 5–15)
BILIRUBIN TOTAL: 1.9 mg/dL — AB (ref 0.3–1.2)
BUN: 14 mg/dL (ref 6–20)
CHLORIDE: 108 mmol/L (ref 101–111)
CO2: 22 mmol/L (ref 22–32)
CREATININE: 0.99 mg/dL (ref 0.61–1.24)
Calcium: 9.1 mg/dL (ref 8.9–10.3)
GFR calc Af Amer: >60 mL/min (ref >60)
Glucose, Bld: 221 mg/dL — ABNORMAL HIGH (ref 65–99)
Potassium: 4.3 mmol/L (ref 3.5–5.1)
Sodium: 138 mmol/L (ref 135–145)
Total Protein: 5.6 g/dL — ABNORMAL LOW (ref 6.5–8.1)

## 2017-09-17 MED ORDER — SENNOSIDES-DOCUSATE SODIUM 8.6-50 MG PO TABS: 1.0000 | ORAL_TABLET | Freq: Every day | ORAL | Status: DC | PRN

## 2017-09-17 MED ORDER — HYDROCODONE-ACETAMINOPHEN 5-325 MG PO TABS: 1.0000 | ORAL_TABLET | ORAL | 0 refills | Status: DC | PRN

## 2017-09-17 MED ORDER — DOCUSATE SODIUM 100 MG PO CAPS: 100.0000 mg | ORAL_CAPSULE | Freq: Two times a day (BID) | ORAL | 0 refills | Status: AC

## 2017-09-17 NOTE — Progress Notes (Signed)
Pt discharged to home with wife. Discharge education complete. No distress, concerns, questions noted.

## 2017-09-17 NOTE — Discharge Instructions (Signed)
You may resume you Xarelto today You may remove your dressing later today and shower.  Replace bandaid as needed

## 2017-09-17 NOTE — Discharge Summary (Signed)
Patient ID: Andres Chang MRN: [id number] DOB/AGE: 1956/02/08 61 y.o.  Admit date: 09/16/2017 Discharge date: 09/17/2017  Supervising Physician: Corrie Mckusick  Admission Diagnoses: metastatic colon cancer with liver mets  Discharge Diagnoses:  Active Problems:   Metastatic colon cancer to liver [facility])   Discharged Condition: good  Hospital Course: the patient was admitted for a MWA of liver mets from colon cancer.  He underwent the procedure and tolerated it very well.  He was mobilizing, tolerating a solid diet, and voiding well on his own on POD 1.  He had no pain and no other complaints.  He was stable for dc home.  He will resume his xarelto today as well.  Consults: None  Discharge Exam: Blood pressure 133/75, pulse 70, temperature 98.6 F (37 C), temperature source Oral, resp. rate 20, height 5\' 10"  (1.778 m), weight 219 lb (99.3 kg), SpO2 94 %. General appearance: alert and no distress Resp: clear to auscultation bilaterally Cardio: regular rate and rhythm GI: soft, non-tender; bowel sounds normal; RUQ stick site is c/d/i with no bleeding or evidence of infection  Disposition: Home   Allergies as of 09/17/2017      Reactions   Cymbalta [duloxetine Hcl] Other (See Comments)   Mental confusion.     Gabapentin Other (See Comments)   Lightheadedness; gait off balance     Penicillins Nausea And Vomiting, Other (See Comments)   ? Syncopal episode Has patient had a PCN reaction causing immediate rash, facial/tongue/throat swelling, SOB or lightheadedness with hypotension: Yes Has patient had a PCN reaction causing severe rash involving mucus membranes or skin necrosis: No Has patient had a PCN reaction that required hospitalization: No Has patient had a PCN reaction occurring within the last 10 years: No If all of the above answers are "NO", then may proceed with Cephalosporin use.      Medication List    TAKE these medications   ALPRAZolam 1 MG tablet Commonly known  as:  XANAX Take 0.5 mg by mouth at bedtime.   AQUAPHOR EX Apply 1 application topically daily as needed (for bruised skin.).   b complex vitamins tablet Take 1 tablet by mouth daily.   CARDIZEM CD 120 MG 24 hr capsule Generic drug:  diltiazem Take 120 mg by mouth daily.   docusate sodium 100 MG capsule Commonly known as:  COLACE Take 1 capsule (100 mg total) by mouth 2 (two) times daily.   FUSION PLUS Caps Take 1 tablet by mouth daily.   HYDROcodone-acetaminophen 5-325 MG tablet Commonly known as:  NORCO/VICODIN Take 1-2 tablets by mouth every 4 (four) hours as needed for moderate pain.   megestrol 400 MG/10ML suspension Commonly known as:  MEGACE Take 800 mg by mouth daily as needed (for appetite).   prochlorperazine 10 MG tablet Commonly known as:  COMPAZINE Take 10 mg by mouth every 4 (four) hours as needed (for nausea/vomiting associated with chemo).   rivaroxaban 20 MG Tabs tablet Commonly known as:  XARELTO Take 20 mg by mouth daily with breakfast.   senna-docusate 8.6-50 MG tablet Commonly known as:  Senokot-S Take 1 tablet by mouth daily as needed for mild constipation or moderate constipation.   spironolactone 50 MG tablet Commonly known as:  ALDACTONE Take 50 mg by mouth daily.            Discharge Care Instructions        Start     Ordered   09/17/17 0000  HYDROcodone-acetaminophen (NORCO/VICODIN) 5-325 MG tablet  Every 4 hours PRN    Question:  Supervising Provider  Answer:  Corrie Mckusick   09/17/17 0845   09/17/17 0000  docusate sodium (COLACE) 100 MG capsule  2 times daily     09/17/17 0845   09/17/17 0000  senna-docusate (SENOKOT-S) 8.6-50 MG tablet  Daily PRN     09/17/17 0845   09/17/17 0000  IR Radiologist Eval & Mgmt    Question Answer Comment  Reason for Exam (SYMPTOM  OR DIAGNOSIS REQUIRED) follow up with Earleen Newport s/p MWA of liver in 4 weeks   Preferred Imaging Location? [facility]      09/17/17 0845     Follow-up  Information    Corrie Mckusick, DO Follow up in 4 week(s).   Specialty:  Interventional Radiology Why:  our office will call you with an appointment date and time Contact information: [address] [place] [address] [phone]            Electronically Signed: Henreitta Cea 09/17/2017, 8:46 AM   I have spent Less Than 30 Minutes discharging Andres Chang.

## 2017-09-19 ENCOUNTER — Encounter (HOSPITAL_COMMUNITY): Payer: Self-pay | Admitting: Interventional Radiology

## 2017-09-19 LAB — TYPE AND SCREEN
ABO/RH(D): A POS
Antibody Screen: NEGATIVE

## 2017-10-03 ENCOUNTER — Encounter: Payer: Self-pay | Admitting: Interventional Radiology

## 2017-11-03 IMAGING — CR DG CHEST 1V
1 series · 1 of 1 positions shown · non-contrast
Comparison: None.

CLINICAL DATA: Preoperative evaluation for upcoming liver ablation

EXAM:
CHEST 1 VIEW

[w chest pa]
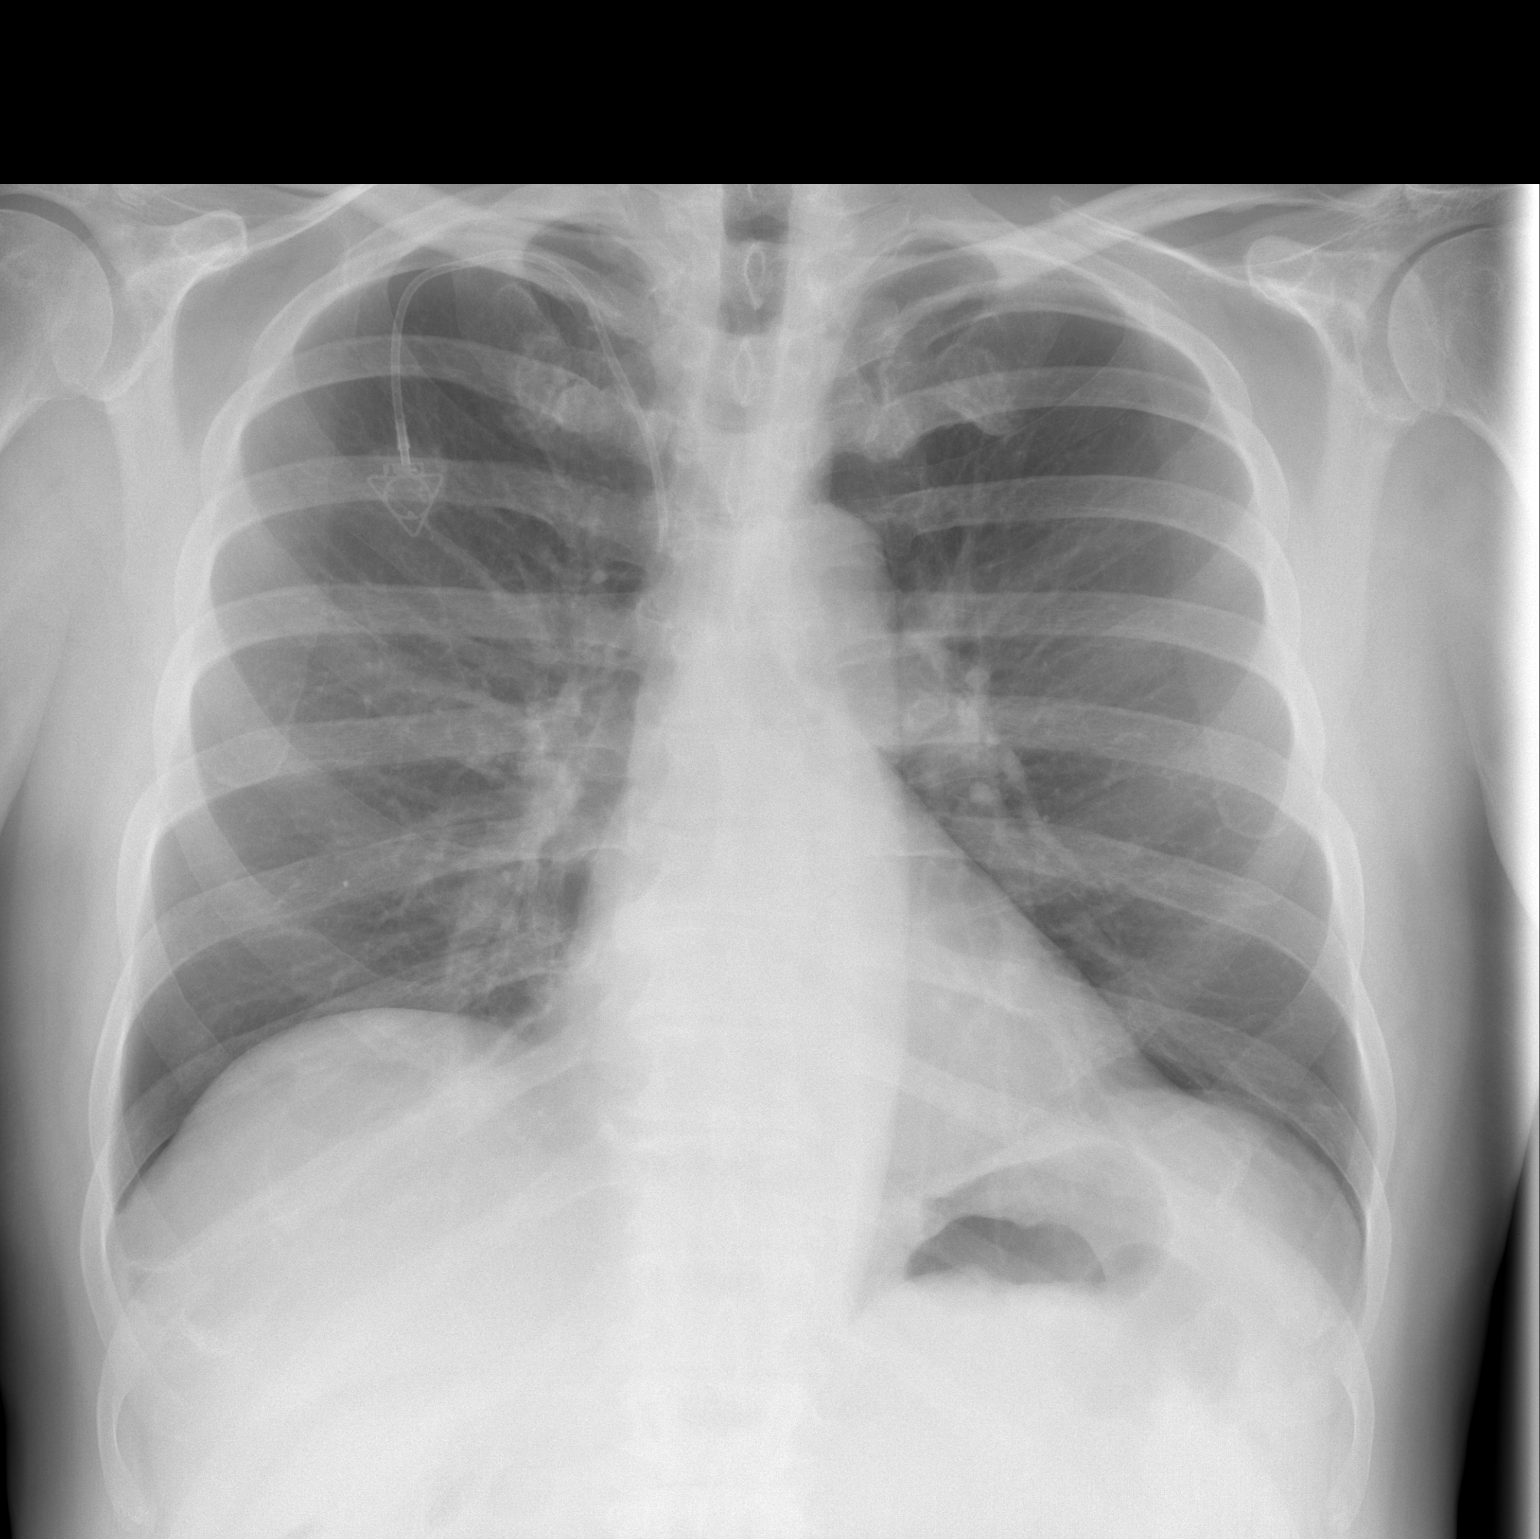

[1 of 1 positions shown; findings below may reference images not displayed]

FINDINGS: Cardiac shadow is within normal limits. The lungs are well aerated
without focal infiltrate. Right chest wall port is noted in the mid
superior vena cava. No other focal abnormality is seen.
IMPRESSION: No active disease.

## 2017-11-06 IMAGING — CT CT GUIDANCE TISSUE ABLATION
4 of 24 series · 10 of 46 positions shown, 14 images · IV contrast (ISOVUE 300)
Comparison: Recent PET-CT [DATE] g Ancef;

INDICATION: 60-year-old male with a history of metastatic colorectal carcinoma.

Most recent PET-CT demonstrated to residual hypermetabolic leads of
the right liver, segment 8, and segment 7.
He presents today for CT-guided ablation.
EXAM:
IMAGE GUIDED TISSUE ABLATION
TECHNIQUE: Informed written consent was obtained from the patient after a
thorough discussion of the procedural risks, benefits and
alternatives. All questions were addressed. Maximal Sterile Barrier
Technique was utilized including caps, mask, sterile gowns, sterile
gloves, sterile drape, hand hygiene and skin antiseptic. A timeout
was performed prior to the initiation of the procedure.

[Series 2: axial arterial · axial · arterial · 0.85mm/px · z∈[-307,-163]mm · 3 of 98 slices shown]
[im 25/98  soft-tissue]
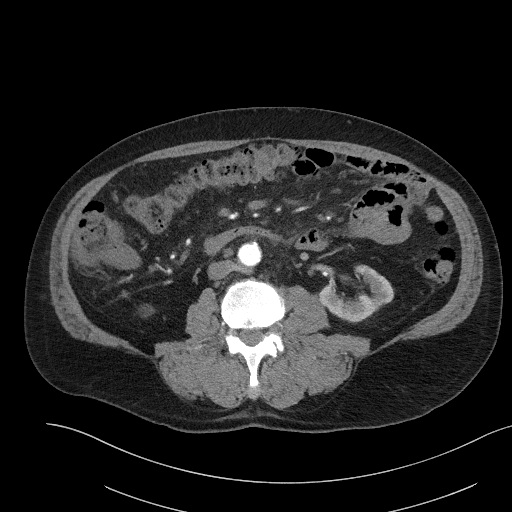
[im 49/98  soft-tissue]
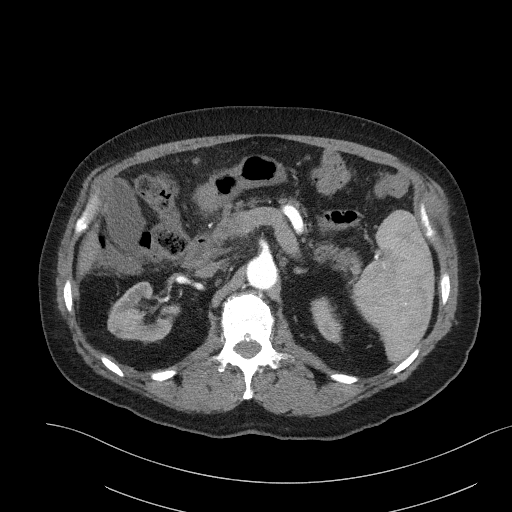
[im 73/98  soft-tissue]
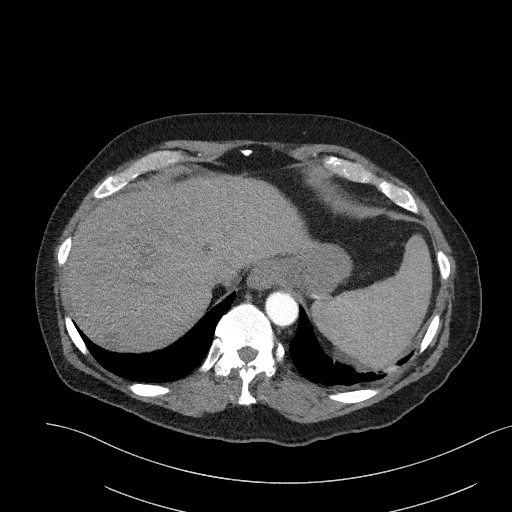

[Series 4: lung arterial · axial · arterial · 0.85mm/px · z∈[-186,-138]mm · 2 of 74 slices shown]
[im 25/74  bone]
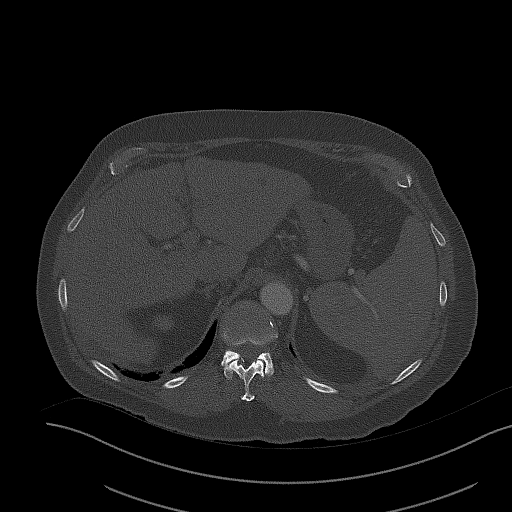
[im 49/74  bone]
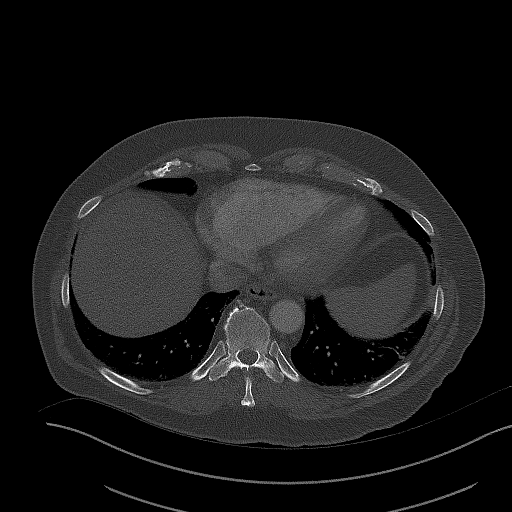

[Series 5: axial venous · axial · portal-venous · 0.85mm/px · z∈[-283,-187]mm · 2 of 98 slices shown, 5 images]
[im 33/98  soft-tissue]
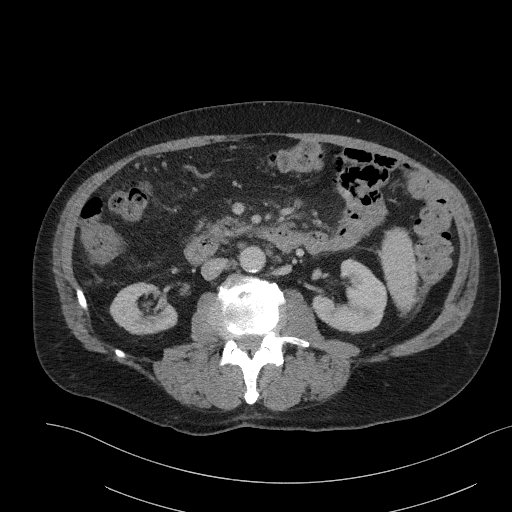
[im 33/98  lung]
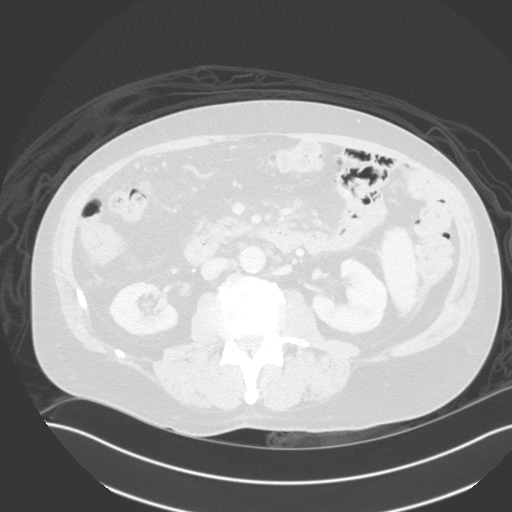
[im 33/98  bone]
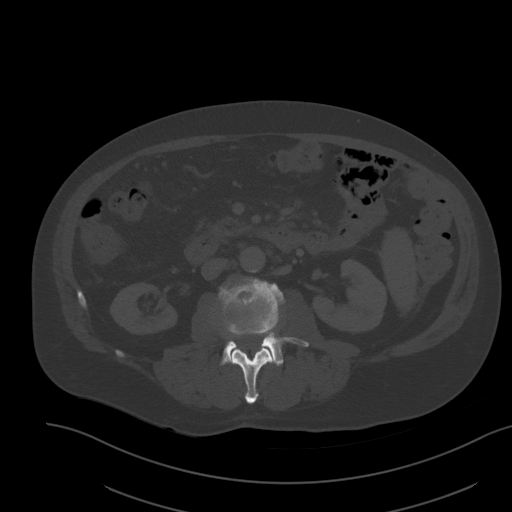
[im 65/98  soft-tissue]
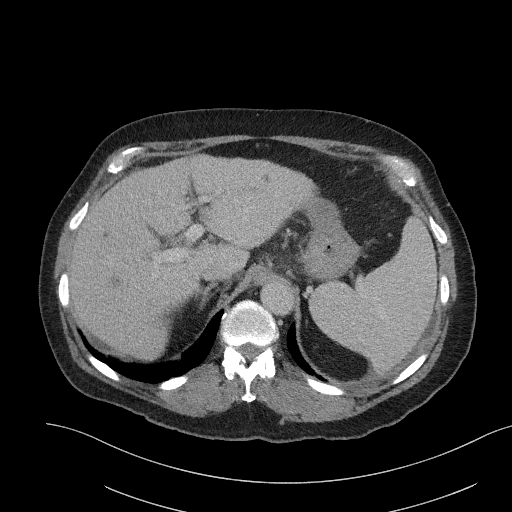
[im 65/98  lung]
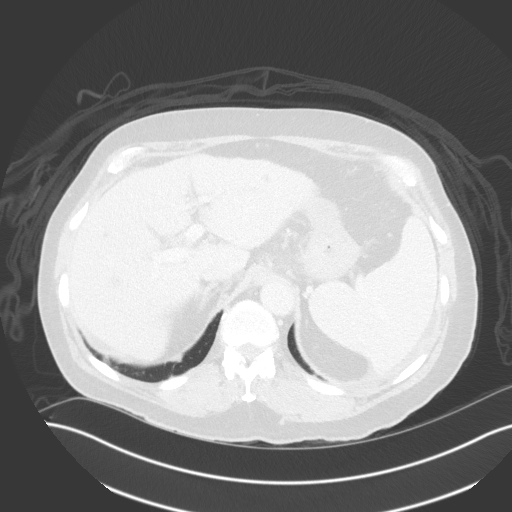

[Series 8: coronal arterial · coronal · arterial · 0.57mm/px · 3 of 101 slices shown, 4 images]
[im 26/101  soft-tissue]
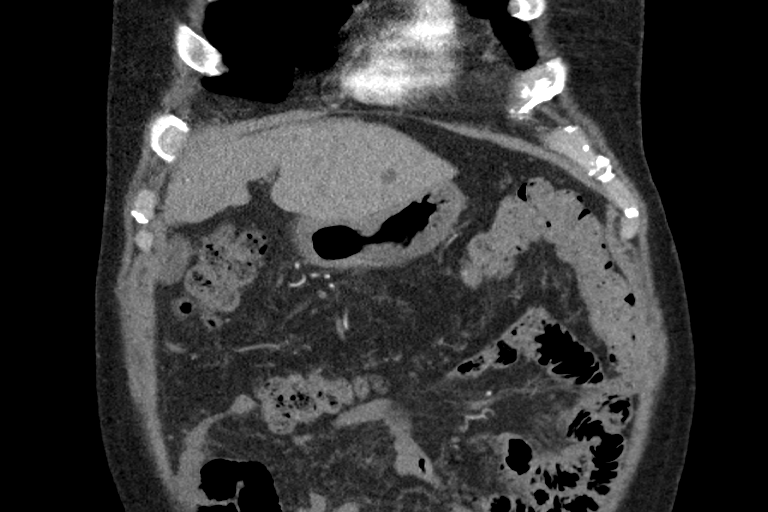
[im 51/101  soft-tissue]
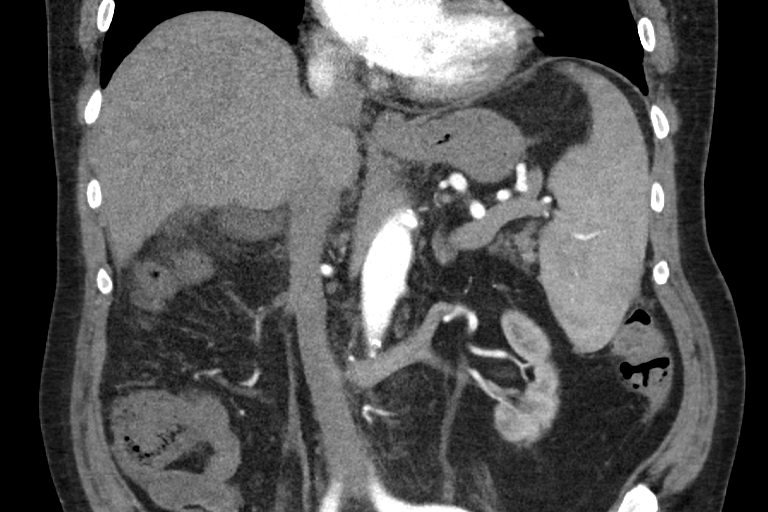
[im 51/101  bone]
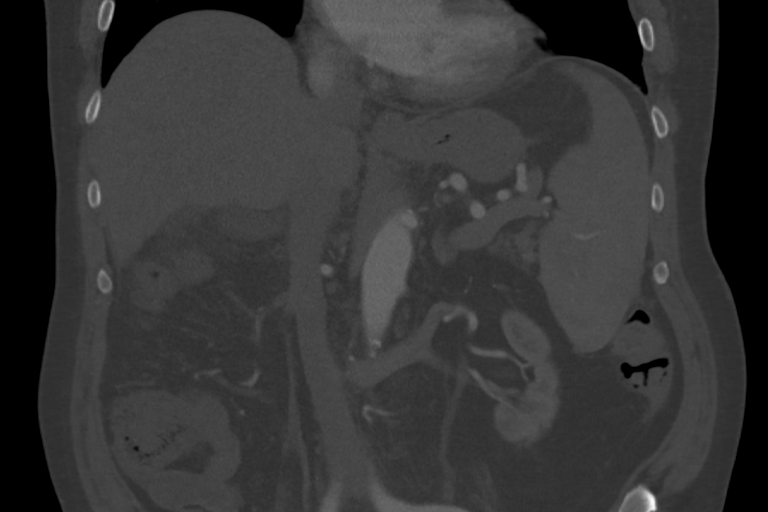
[im 76/101  soft-tissue]
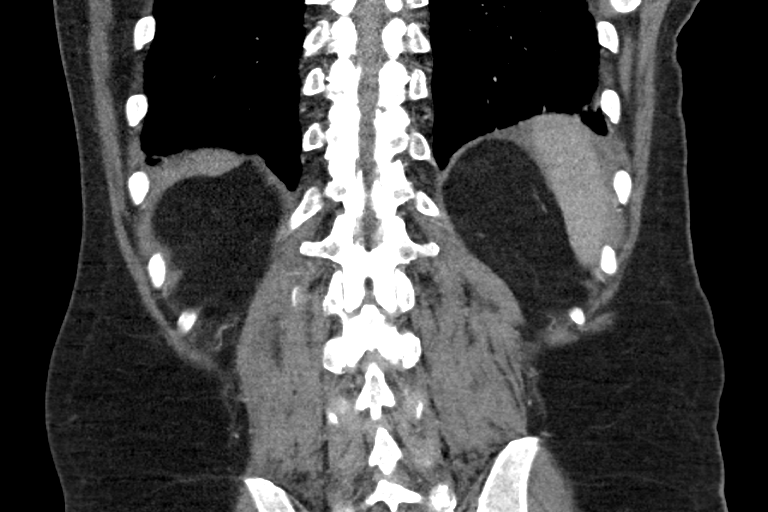

[10 of 46 positions shown; findings below may reference images not displayed]

The antibiotic was administered in an appropriate time
interval prior to needle puncture of the skin.

ANESTHESIA/SEDATION:
General - as administered by the Anesthesia department

FLUOROSCOPY TIME:  CT

COMPLICATIONS:
SIR Level A - No therapy, no consequence.
Patient was positioned supine position on the CT gantry table and a
three-phase liver CT was performed for planning purposes.

Anesthesia was then present for induction of general anesthesia.

The right upper quadrant was then prepped and draped in the usual
sterile fashion. Ultrasound guidance was then used to place to
fiducial needles into the segment 8 and segment 7 lesions.

Repeat CT was performed to confirm location of needle TIPS, and once
the needles were repositioned into appropriate location to identify
the separate lesions, we proceeded with placement of the PR probes.

Using a combination of ultrasound and CT guidance, a single 17 gauge
15 cm length PR probe was placed into each lesion within segment 8
and segment 7.

Repeat CT image with contrast was performed, and then mild
adjustments were used using ultrasound and CT guidance to confirm
location.

Once we confirmed location of the needles within the lesions,
ablation was performed with 10 minutes of total ablation time at 65
Ourari.

Final CT image was acquired.

Needles were removed with cautery function, and sterile bandages
were placed.

Patient tolerated the procedure well and remained hemodynamically
stable throughout.

Small pericapsular hemorrhage adjacent to the liver was identified,
although the patient remained hemodynamically stable.

The patient was then woken up from gentle anesthesia and transported
stable condition to the postoperative anesthesia care unit.
FINDINGS: Diagnostic CT:

Lower lungs:  Minimal atelectasis.

Hepatobiliary: Similar appearance of the liver, with
hypoattenuated/hypoenhancing soft tissue lesion of segment 8
measuring 14 mm, similar to the appearance on recent PET-CT.

Similar appearance of hypoattenuating/hypoenhancing soft tissue
lesion within segment 7, extending to the liver capsule measuring
approximately 18 mm.

Similar appearance of small subcentimeter hypoattenuating lesion in
segment 6 which was present on the prior PET-CT without FDG
activity. There are additional small hypodensities which were
present previously.

Unremarkable appearance of the gallbladder.

Pancreas:  Unremarkable

Spleen:  Greatest diameter of the spleen measures 14 cm.

GI/small bowel: Unremarkable appearance of stomach with small hiatal
hernia. Small bowel unremarkable with no abnormal distention. No
transition point.

Colon with diverticular change in no associated inflammation.

Mesenteric: Mystic mesenteric with no adenopathy, potentially
representing edema. No soft tissue lesions.

Vascular: Scattered calcifications of the abdominal aorta.
Mesenteric vessels and bilateral renal arteries patent.

Adrenal glands/kidneys:

Unremarkable appearance of the adrenal glands.

No evidence of left-sided or right-sided hydronephrosis.

Ablation:

Images during the case demonstrate appropriate positioning of single
PR probe into the segment 8 lesion with the ablation defect centered
at the lesion upon completion.

Images during case demonstrate appropriate position of single PR
probe into the segment 7 lesion, with the ablation defect centered
at the lesion upon completion.

Small hematoma surrounding the liver.

Atelectatic changes at the lung bases.
IMPRESSION: Status post image guided tissue ablation of segment 8 and segment 7
metastatic colorectal tumor, treating each with a single 17 gauge PR
probe 65 Ourari for 10 minutes of ablation time.

## 2017-11-08 ENCOUNTER — Ambulatory Visit
Admission: RE | Admit: 2017-11-08 | Discharge: 2017-11-08 | Disposition: A | Discharge status: Home / Self Care | Payer: BLUE CROSS/BLUE SHIELD | Source: Ambulatory Visit | Attending: General Surgery | Admitting: General Surgery

## 2017-11-08 DIAGNOSIS — C189 Malignant neoplasm of colon, unspecified: Secondary | ICD-10-CM

## 2017-11-08 DIAGNOSIS — C787 Secondary malignant neoplasm of liver and intrahepatic bile duct: Principal | ICD-10-CM

## 2017-11-08 HISTORY — PX: IR RADIOLOGIST EVAL & MGMT: IMG5224

## 2017-11-08 NOTE — Progress Notes (Signed)
Chief Complaint: I have colon cancer  Referring Physician(s): Dr. Humphrey Rolls PCP: Dr. Concepcion Elk  History of Present Illness: Andres Chang is a 61 y.o. male presenting as a scheduled follow up appointment to [facility] [place] [facility], SP CT guided microwave ablation of 2 separate metastatic implants of the right liver.  This was performed 09/16/2017 at [facility].    He was discharged home the day afterwards, and he tells me that he has done fine with no new symptoms.  He denies any pain.  He has not had any fevers/rigors/chills.  He has healed fine at our insertions sites.    He tells me that he sees Dr. Humphrey Rolls every 2-3 weeks.    We have not yet completed any post-op imaging, which will be done at the 3 month interval.     Past Medical History:  Diagnosis Date  . Cancer ([place])    colon ca mets to liver   . Dysrhythmia    atrial fib   . History of blood transfusion   . History of chemotherapy   . History of GI bleed   . History of kidney stones   . Neuropathy   . Pneumonia    hx of   . Port catheter in place     Past Surgical History:  Procedure Laterality Date  . APPENDECTOMY    . IR RADIOLOGIST EVAL & MGMT  08/23/2017  . kidney stent      due to kidney stones   . left fifth finger amputation     . port a cath placement    . RADIOLOGY WITH ANESTHESIA N/A 09/16/2017   Procedure: CT - MICROWAVE ABLATION LIVER;  Surgeon: Corrie Mckusick, DO;  Location: [facility]S;  Service: Anesthesiology;  Laterality: N/A;  . surgery related to GI bleed       Allergies: Cymbalta [duloxetine hcl]; Gabapentin; and Penicillins  Medications: Prior to Admission medications   Medication Sig Start Date End Date Taking? Authorizing Provider  ALPRAZolam Duanne Moron) 1 MG tablet Take 0.5 mg by mouth at bedtime.    Yes [provider]  b complex vitamins tablet Take 1 tablet by mouth daily.   Yes [provider]  diltiazem (CARDIZEM CD) 120 MG 24 hr capsule Take 120 mg by mouth daily.   Yes  [provider]  docusate sodium (COLACE) 100 MG capsule Take 1 capsule (100 mg total) by mouth 2 (two) times daily. 09/17/17  Yes Saverio Danker, PA-C  Emollient (AQUAPHOR EX) Apply 1 application topically daily as needed (for bruised skin.).   Yes [provider]  Iron-FA-B Cmp-C-Biot-Probiotic (FUSION PLUS) CAPS Take 1 tablet by mouth daily.   Yes [provider]  prochlorperazine (COMPAZINE) 10 MG tablet Take 10 mg by mouth every 4 (four) hours as needed (for nausea/vomiting associated with chemo).    Yes [provider]  rivaroxaban (XARELTO) 20 MG TABS tablet Take 20 mg by mouth daily with breakfast.    Yes [provider]  spironolactone (ALDACTONE) 50 MG tablet Take 50 mg by mouth daily.   Yes [provider]  HYDROcodone-acetaminophen (NORCO/VICODIN) 5-325 MG tablet Take 1-2 tablets by mouth every 4 (four) hours as needed for moderate pain. Patient not taking: Reported on 11/08/2017 09/17/17   Saverio Danker, PA-C  megestrol (MEGACE) 400 MG/10ML suspension Take 800 mg by mouth daily as needed (for appetite).     [provider]  senna-docusate (SENOKOT-S) 8.6-50 MG tablet Take 1 tablet by mouth daily as  needed for mild constipation or moderate constipation. Patient not taking: Reported on 11/08/2017 09/17/17   Saverio Danker, PA-C     No family history on file.  Social History   Socioeconomic History  . Marital status: Married    Spouse name: Not on file  . Number of children: Not on file  . Years of education: Not on file  . Highest education level: Not on file  Social Needs  . Financial resource strain: Not on file  . Food insecurity - worry: Not on file  . Food insecurity - inability: Not on file  . Transportation needs - medical: Not on file  . Transportation needs - non-medical: Not on file  Occupational History  . Not on file  Tobacco Use  . Smoking status: Never Smoker  . Smokeless tobacco: Never Used    Substance and Sexual Activity  . Alcohol use: No  . Drug use: No  . Sexual activity: Not on file  Other Topics Concern  . Not on file  Social History Narrative  . Not on file    ECOG Status: 0 - Asymptomatic  Review of Systems: A 12 point ROS discussed and pertinent positives are indicated in the HPI above.  All other systems are negative.  Review of Systems  Vital Signs: BP 132/67   Pulse 67   Temp 97.8 F (36.6 C) (Oral)   Resp 15   Ht 5\' 10"  (1.778 m)   Wt 205 lb (93 kg)   SpO2 96%   BMI 29.41 kg/m   Physical Exam Targeted exam of the upper abdomen shows excellent healing at our access sites.    Mallampati Score:     Imaging: No results found.  Labs:  CBC: Recent Labs    09/13/17 1332 09/16/17 0706 09/17/17 0338  WBC 6.7 6.7 6.6  HGB 13.7 13.7 11.6*  HCT 39.6 39.6 34.1*  PLT 72* 69* 87*    COAGS: Recent Labs    09/13/17 1332 09/16/17 0706  INR 1.68 1.11    BMP: Recent Labs    09/13/17 1332 09/16/17 0706 09/17/17 0338  NA 141 138 138  K 4.1 3.8 4.3  CL 109 107 108  CO2 24 22 22   GLUCOSE 103* 101* 221*  BUN 11 12 14   CALCIUM 9.7 9.3 9.1  CREATININE 1.01 0.93 0.99  GFRNONAA >60 >60 >60  GFRAA >60 >60 >60    LIVER FUNCTION TESTS: Recent Labs    09/13/17 1332 09/16/17 0706 09/17/17 0338  BILITOT 1.5* 1.7* 1.9*  AST 50* 47* 211*  ALT 39 41 146*  ALKPHOS 72 71 65  PROT 6.3* 6.2* 5.6*  ALBUMIN 3.6 3.5 3.2*    TUMOR MARKERS: No results for input(s): AFPTM, CEA, CA199, CHROMGRNA in the last 8760 hours.  Assessment and Plan:  Andres Chang is 61 yo male with a history of metastatic colorectal carcinoma, treated by Dr. Humphrey Rolls in [facility] [address], SP CT guided ablation of 2 metastatic implants of the right liver.  This was performed at [facility] on 09/16/2017.  He has done very well.   He will continue to see Dr. Humphrey Rolls, which he tells me will be every 2-3 weeks.    At this point, our strategy will be surveillance, with the first  CT abd/pelvis with contrast in late December, which is ~3 months after his treatment.    I have encouraged him to observe all of his follow up appointments, and we will schedule his first surveillance CT.  He understands our plan of care.   Plan: - CT abd/pelvis with contrast (liver protocol) in late December with appointment   Electronically Signed: Corrie Mckusick 11/08/2017, 1:10 PM   I spent a total of    25 Minutes in face to face in clinical consultation, greater than 50% of which was counseling/coordinating care for metastatic colorectal carcinoma, SP microwave ablation of 2 metastatic implants of the liver

## 2017-11-29 ENCOUNTER — Encounter: Payer: Self-pay | Admitting: Interventional Radiology

## 2017-12-07 ENCOUNTER — Other Ambulatory Visit: Payer: Self-pay | Admitting: Radiology

## 2017-12-07 ENCOUNTER — Other Ambulatory Visit: Payer: Self-pay | Admitting: Interventional Radiology

## 2017-12-07 DIAGNOSIS — C787 Secondary malignant neoplasm of liver and intrahepatic bile duct: Secondary | ICD-10-CM

## 2018-01-10 ENCOUNTER — Encounter: Payer: Self-pay | Admitting: *Deleted

## 2018-01-10 ENCOUNTER — Ambulatory Visit
Admission: RE | Admit: 2018-01-10 | Discharge: 2018-01-10 | Disposition: A | Discharge status: Home / Self Care | Payer: BLUE CROSS/BLUE SHIELD | Source: Ambulatory Visit | Attending: Interventional Radiology | Admitting: Interventional Radiology

## 2018-01-10 DIAGNOSIS — C787 Secondary malignant neoplasm of liver and intrahepatic bile duct: Secondary | ICD-10-CM

## 2018-01-10 HISTORY — PX: IR RADIOLOGIST EVAL & MGMT: IMG5224

## 2018-01-10 NOTE — Progress Notes (Signed)
Chief Complaint: Metastatic colorectal carcinoma  Referring Physician(s): Dr. Humphrey Rolls  History of Present Illness: Andres Chang is a 62 y.o. male presenting today as a scheduled follow up to Vascular & Interventional Radiology clinic, SP image guided microwave ablation of 2 FDG-avid metastatic lesions within the right liver lobe.    Andres Chang is here today with his wife for our appointment.  He was treated with image guided microwave ablation of liver lesions 09/16/2017.  The lesions that we treated were residual disease from his overall larger burden of disease that was present before chemotherapy, with the baseline CT to compare 08/27/2015.  At that time, he had multiple lesions, which significantly decreased in size with his chemotherapy.    He tells me that after his treatment with [place] he has done just fine.  He feels like he is able to maintain his activity, and participate in all of his usual activity.  I would say he has an ECOG of 0.  He is unrestricted.  He denies any pain symptoms or GI symptoms.  He has a good appetite.    He continues to receive chemotherapy, and he has his next follow up appointment with Dr. Humphrey Rolls next week.    He has had his most recent follow up CT scan completed 12/27/2017.  The final report makes note of both of the treatment defects of our treated lesions of the right liver.  There is also a note regarding 2 additional lesions of the right liver.  1 lesion in the dome, and 1 lesion in segment 6 in the posterior liver, these seem like they may be in the region of previous disease on his baseline CT from 08/27/2015.  Neither of these spots had FDG activity on the PET from 08/02/2017.  These may or may not represent progression.    We discussed his CT imaging at length.    Past Medical History:  Diagnosis Date  . Cancer ([place])    colon ca mets to liver   . Dysrhythmia    atrial fib   . History of blood transfusion   . History of chemotherapy   . History of GI bleed    . History of kidney stones   . Neuropathy   . Pneumonia    hx of   . Port catheter in place     Past Surgical History:  Procedure Laterality Date  . APPENDECTOMY    . IR RADIOLOGIST EVAL & MGMT  08/23/2017  . IR RADIOLOGIST EVAL & MGMT  11/08/2017  . kidney stent      due to kidney stones   . left fifth finger amputation     . port a cath placement    . RADIOLOGY WITH ANESTHESIA N/A 09/16/2017   Procedure: CT - MICROWAVE ABLATION LIVER;  Surgeon: Corrie Mckusick, DO;  Location: [facility];  Service: Anesthesiology;  Laterality: N/A;  . surgery related to GI bleed       Allergies: Cymbalta [duloxetine hcl]; Gabapentin; and Penicillins  Medications: Prior to Admission medications   Medication Sig Start Date End Date Taking? Authorizing Provider  ALPRAZolam Duanne Moron) 1 MG tablet Take 0.5 mg by mouth at bedtime.    Yes [provider]  b complex vitamins tablet Take 1 tablet by mouth daily.   Yes [provider]  diltiazem (CARDIZEM CD) 120 MG 24 hr capsule Take 120 mg by mouth daily.   Yes [provider]  docusate sodium (COLACE) 100 MG capsule Take 1  capsule (100 mg total) by mouth 2 (two) times daily. 09/17/17  Yes Saverio Danker, PA-C  Emollient (AQUAPHOR EX) Apply 1 application topically daily as needed (for bruised skin.).   Yes [provider]  Iron-FA-B Cmp-C-Biot-Probiotic (FUSION PLUS) CAPS Take 1 tablet by mouth daily.   Yes [provider]  megestrol (MEGACE) 400 MG/10ML suspension Take 800 mg by mouth daily as needed (for appetite).    Yes [provider]  rivaroxaban (XARELTO) 20 MG TABS tablet Take 20 mg by mouth daily with breakfast.    Yes [provider]  senna-docusate (SENOKOT-S) 8.6-50 MG tablet Take 1 tablet by mouth daily as needed for mild constipation or moderate constipation. 09/17/17  Yes Saverio Danker, PA-C  spironolactone (ALDACTONE) 50 MG tablet Take 50 mg by mouth daily.   Yes [provider]   HYDROcodone-acetaminophen (NORCO/VICODIN) 5-325 MG tablet Take 1-2 tablets by mouth every 4 (four) hours as needed for moderate pain. Patient not taking: Reported on 11/08/2017 09/17/17   Saverio Danker, PA-C  prochlorperazine (COMPAZINE) 10 MG tablet Take 10 mg by mouth every 4 (four) hours as needed (for nausea/vomiting associated with chemo).     [provider]     No family history on file.  Social History   Socioeconomic History  . Marital status: Married    Spouse name: Not on file  . Number of children: Not on file  . Years of education: Not on file  . Highest education level: Not on file  Social Needs  . Financial resource strain: Not on file  . Food insecurity - worry: Not on file  . Food insecurity - inability: Not on file  . Transportation needs - medical: Not on file  . Transportation needs - non-medical: Not on file  Occupational History  . Not on file  Tobacco Use  . Smoking status: Never Smoker  . Smokeless tobacco: Never Used  Substance and Sexual Activity  . Alcohol use: No  . Drug use: No  . Sexual activity: Not on file  Other Topics Concern  . Not on file  Social History Narrative  . Not on file    ECOG Status: 0 - Asymptomatic  Review of Systems: A 12 point ROS discussed and pertinent positives are indicated in the HPI above.  All other systems are negative.  Review of Systems  Vital Signs: BP (!) 146/75   Pulse 68   Temp 98 F (36.7 C) (Oral)   Resp 14   Ht 5\' 10"  (1.778 m)   Wt 208 lb (94.3 kg)   SpO2 98%   BMI 29.84 kg/m   Physical Exam General: 62 yo male appearing stated age.  Well-developed, well-nourished.  No distress. HEENT: Atraumatic, normocephalic.  Conjugate gaze, extra-ocular motor intact. No scleral icterus or scleral injection. No lesions on external ears, nose, lips, or gums.  Oral mucosa moist, pink.  Neck: Symmetric with no goiter enlargement.  Chest/Lungs:  Symmetric chest with inspiration/expiration.  No  labored breathing.   Heart:   No JVD appreciated.  Abdomen:  Soft, NT/ND, with + bowel sounds.   Genito-urinary: Deferred Neurologic: Alert & Oriented to person, place, and time.   Normal affect and insight.  Appropriate questions.  Moving all 4 extremities with gross sensory intact.   Imaging: No results found.  Labs:  CBC: Recent Labs    09/13/17 1332 09/16/17 0706 09/17/17 0338  WBC 6.7 6.7 6.6  HGB 13.7 13.7 11.6*  HCT 39.6 39.6 34.1*  PLT  72* 69* 87*    COAGS: Recent Labs    09/13/17 1332 09/16/17 0706  INR 1.68 1.11    BMP: Recent Labs    09/13/17 1332 09/16/17 0706 09/17/17 0338  NA 141 138 138  K 4.1 3.8 4.3  CL 109 107 108  CO2 24 22 22   GLUCOSE 103* 101* 221*  BUN 11 12 14   CALCIUM 9.7 9.3 9.1  CREATININE 1.01 0.93 0.99  GFRNONAA >60 >60 >60  GFRAA >60 >60 >60    LIVER FUNCTION TESTS: Recent Labs    09/13/17 1332 09/16/17 0706 09/17/17 0338  BILITOT 1.5* 1.7* 1.9*  AST 50* 47* 211*  ALT 39 41 146*  ALKPHOS 72 71 65  PROT 6.3* 6.2* 5.6*  ALBUMIN 3.6 3.5 3.2*    TUMOR MARKERS: No results for input(s): AFPTM, CEA, CA199, CHROMGRNA in the last 8760 hours.  Assessment and Plan:  Andres Chang is a 62 year old male undergoing maintenance therapy for metastatic colorectal carcinoma.  In addition to his chemotherapy, he is now status-post image-guided microwave ablation of 2 residual metastatic implants of the right liver which had been previously reduced by his chemo.    He has done just fine after our treatment, with ECOG of 0, and no current complaints.   Repeat CT imaging performed 12/27/2017 shows ablation defects of the treated lesions, but also 2 other concerning areas.  These include small lesion at the liver dome, and small lesion at the posterior segment 6.  I discussed the meaning of these with Andres Chang and his wife.  Given the small size and the absence of FDG activity within these lesions on the prior PET, I think it is reasonable to use  surveillance strategy, with follow up CT in ~3-4 months.  At that time, we can decide if there is a growth/change trajectory that would require treatment. These 2 additional lesions are very near where there was larger burden of disease on the initial CT imaging from 2016, and may or may not indicate recurrence/progression.   If another treatment is necessary, my impression is that y90 would be the next best treatment option considering his original burden, location, and the appearance overall. This would be done with mapping and then bilobar treatment.  I did not complete a comprehensive informed consent at this time.  We can do that in the future if necessary.    I explained our surveillance strategy, and answered all of their questions.    He and his wife understand our plan.   Plan: - Continue current care - Will have a follow up clinic visit with repeat contrast enhanced CT abd/pelvis, liver protocol, in 3-4 months.  - I have advised him to observe his other doctor appointments.   Electronically Signed: Corrie Mckusick 01/10/2018, 3:30 PM   I spent a total of    25 Minutes in face to face in clinical consultation, greater than 50% of which was counseling/coordinating care for metastatic colorectal carcinoma, status post image guided ablation of liver metastasis.

## 2018-03-20 ENCOUNTER — Other Ambulatory Visit: Payer: Self-pay | Admitting: Interventional Radiology

## 2018-03-20 DIAGNOSIS — C787 Secondary malignant neoplasm of liver and intrahepatic bile duct: Principal | ICD-10-CM

## 2018-03-20 DIAGNOSIS — C20 Malignant neoplasm of rectum: Secondary | ICD-10-CM

## 2018-04-05 ENCOUNTER — Other Ambulatory Visit: Payer: BLUE CROSS/BLUE SHIELD

## 2018-06-01 ENCOUNTER — Ambulatory Visit
Admission: RE | Admit: 2018-06-01 | Discharge: 2018-06-01 | Disposition: A | Discharge status: Home / Self Care | Payer: BLUE CROSS/BLUE SHIELD | Source: Ambulatory Visit | Attending: Interventional Radiology | Admitting: Interventional Radiology

## 2018-06-01 ENCOUNTER — Encounter: Payer: Self-pay | Admitting: Radiology

## 2018-06-01 DIAGNOSIS — C20 Malignant neoplasm of rectum: Secondary | ICD-10-CM

## 2018-06-01 DIAGNOSIS — C787 Secondary malignant neoplasm of liver and intrahepatic bile duct: Principal | ICD-10-CM

## 2018-06-01 HISTORY — PX: IR RADIOLOGIST EVAL & MGMT: IMG5224

## 2018-06-01 NOTE — Progress Notes (Signed)
Chief Complaint: Colorectal Carcinoma, recent y90  Referring Physician(s): Dr. Humphrey Rolls.   History of Present Illness: Andres Chang is a 62 y.o. male returning today for his follow up visit, status-post recent initial y90 therapy to the right liver.    We treated his right liver 05/08/2018 with y90 dose, for new metastatic colorectal carcinoma.    He tells me that he felt a bit of fatigue and decreased appetite after the treatment, but that has recovered entirely, and he feels fine now.  He remains active and essentially asymptomatic at this time.    I do not have all of the notes available to me at this time, as some of his information is within the [facility] system.      Past Medical History:  Diagnosis Date  . Cancer ([place])    colon ca mets to liver   . Dysrhythmia    atrial fib   . History of blood transfusion   . History of chemotherapy   . History of GI bleed   . History of kidney stones   . Neuropathy   . Pneumonia    hx of   . Port catheter in place     Past Surgical History:  Procedure Laterality Date  . APPENDECTOMY    . IR RADIOLOGIST EVAL & MGMT  08/23/2017  . IR RADIOLOGIST EVAL & MGMT  11/08/2017  . IR RADIOLOGIST EVAL & MGMT  01/10/2018  . IR RADIOLOGIST EVAL & MGMT  06/01/2018  . kidney stent      due to kidney stones   . left fifth finger amputation     . port a cath placement    . RADIOLOGY WITH ANESTHESIA N/A 09/16/2017   Procedure: CT - MICROWAVE ABLATION LIVER;  Surgeon: Corrie Mckusick, DO;  Location: [facility];  Service: Anesthesiology;  Laterality: N/A;  . surgery related to GI bleed       Allergies: Cymbalta [duloxetine hcl]; Gabapentin; and Penicillins  Medications: Prior to Admission medications   Medication Sig Start Date End Date Taking? Authorizing Provider  ALPRAZolam Duanne Moron) 1 MG tablet Take 0.5 mg by mouth at bedtime.    Yes [provider]  diltiazem (CARDIZEM CD) 120 MG 24 hr capsule Take 120 mg by mouth daily.   Yes  [provider]  docusate sodium (COLACE) 100 MG capsule Take 1 capsule (100 mg total) by mouth 2 (two) times daily. 09/17/17  Yes Saverio Danker, PA-C  Emollient (AQUAPHOR EX) Apply 1 application topically daily as needed (for bruised skin.).   Yes [provider]  Iron-FA-B Cmp-C-Biot-Probiotic (FUSION PLUS) CAPS Take 1 tablet by mouth daily.   Yes [provider]  rivaroxaban (XARELTO) 20 MG TABS tablet Take 20 mg by mouth daily with breakfast.    Yes [provider]  spironolactone (ALDACTONE) 50 MG tablet Take 50 mg by mouth daily.   Yes [provider]  b complex vitamins tablet Take 1 tablet by mouth daily.    [provider]  HYDROcodone-acetaminophen (NORCO/VICODIN) 5-325 MG tablet Take 1-2 tablets by mouth every 4 (four) hours as needed for moderate pain. Patient not taking: Reported on 11/08/2017 09/17/17   Saverio Danker, PA-C  megestrol (MEGACE) 400 MG/10ML suspension Take 800 mg by mouth daily as needed (for appetite).     [provider]  prochlorperazine (COMPAZINE) 10 MG tablet Take 10 mg by mouth every 4 (four) hours as needed (for nausea/vomiting associated with chemo).     [provider]  senna-docusate (SENOKOT-S) 8.6-50 MG tablet Take 1 tablet by mouth daily as needed for mild constipation or moderate constipation. Patient not taking: Reported on 06/01/2018 09/17/17   Saverio Danker, PA-C     No family history on file.  Social History   Socioeconomic History  . Marital status: Married    Spouse name: Not on file  . Number of children: Not on file  . Years of education: Not on file  . Highest education level: Not on file  Occupational History  . Not on file  Social Needs  . Financial resource strain: Not on file  . Food insecurity:    Worry: Not on file    Inability: Not on file  . Transportation needs:    Medical: Not on file    Non-medical: Not on file  Tobacco Use  . Smoking status: Never  Smoker  . Smokeless tobacco: Never Used  Substance and Sexual Activity  . Alcohol use: No  . Drug use: No  . Sexual activity: Not on file  Lifestyle  . Physical activity:    Days per week: Not on file    Minutes per session: Not on file  . Stress: Not on file  Relationships  . Social connections:    Talks on phone: Not on file    Gets together: Not on file    Attends religious service: Not on file    Active member of club or organization: Not on file    Attends meetings of clubs or organizations: Not on file    Relationship status: Not on file  Other Topics Concern  . Not on file  Social History Narrative  . Not on file    ECOG Status: 0 - Asymptomatic  Review of Systems: A 12 point ROS discussed and pertinent positives are indicated in the HPI above.  All other systems are negative.  Review of Systems  Vital Signs: BP 140/72   Pulse 81   Temp 98.1 F (36.7 C) (Oral)   Resp 16   Ht 5\' 10"  (1.778 m)   Wt 198 lb (89.8 kg)   SpO2 96%   BMI 28.41 kg/m   Physical Exam  Targeted exam of the right inguinal region reveals good pulse, with no tenderness, and no evidence of pseudoaneurysm.     Imaging: Ir Radiologist Eval & Mgmt  Result Date: 06/01/2018 Please refer to notes tab for details about interventional procedure. (Op Note)   Labs:  CBC: Recent Labs    09/13/17 1332 09/16/17 0706 09/17/17 0338  WBC 6.7 6.7 6.6  HGB 13.7 13.7 11.6*  HCT 39.6 39.6 34.1*  PLT 72* 69* 87*    COAGS: Recent Labs    09/13/17 1332 09/16/17 0706  INR 1.68 1.11    BMP: Recent Labs    09/13/17 1332 09/16/17 0706 09/17/17 0338  NA 141 138 138  K 4.1 3.8 4.3  CL 109 107 108  CO2 24 22 22   GLUCOSE 103* 101* 221*  BUN 11 12 14   CALCIUM 9.7 9.3 9.1  CREATININE 1.01 0.93 0.99  GFRNONAA >60 >60 >60  GFRAA >60 >60 >60    LIVER FUNCTION TESTS: Recent Labs    09/13/17 1332 09/16/17 0706 09/17/17 0338  BILITOT 1.5* 1.7* 1.9*  AST 50* 47* 211*  ALT 39 41  146*  ALKPHOS 72 71 65  PROT 6.3* 6.2* 5.6*  ALBUMIN 3.6 3.5 3.2*    TUMOR MARKERS: No results for input(s): AFPTM, CEA, CA199, CHROMGRNA in  the last 8760 hours.  Assessment and Plan:  Mr Andres Chang is a 62 year old male with a history of metastatic colorectal carcinoma.    We have initiated liver directed therapy, and most recently performed his first y90  treatment of progressive liver mets.  This was performed 05/08/2018.    In our clinic, I do not have all of his notes, as he gets some of his care in the [facility].    On my own follow up the day after his visit, tbili is 1.7 (05/24/2018), and CEA is 48.3 05/24/2018), both in the [facility] system  He has now recovered fully from this first treatment, and we will plan on proceeding to dosing of the left liver with y90.    Our team will be reaching out to him to get this scheduled.    Surveillance CT will be performed in ~3 months after the next treatment, to make sure that we have enough healing time to best assess his response.    Plan is to proceed with left liver treatment, y90.    Electronically Signed: Corrie Mckusick 06/01/2018, 6:19 PM   I spent a total of    25 Minutes in face to face in clinical consultation, greater than 50% of which was counseling/coordinating care for metastatic colorectal carcinoma, y90 therapy, with upcoming left liver treatment.

## 2018-07-18 ENCOUNTER — Other Ambulatory Visit: Payer: Self-pay | Admitting: Interventional Radiology

## 2018-07-18 DIAGNOSIS — C189 Malignant neoplasm of colon, unspecified: Secondary | ICD-10-CM

## 2018-07-18 DIAGNOSIS — C787 Secondary malignant neoplasm of liver and intrahepatic bile duct: Principal | ICD-10-CM

## 2018-08-16 ENCOUNTER — Other Ambulatory Visit: Payer: Self-pay

## 2018-08-23 ENCOUNTER — Ambulatory Visit
Admission: RE | Admit: 2018-08-23 | Discharge: 2018-08-23 | Disposition: A | Discharge status: Home / Self Care | Payer: Medicare HMO | Source: Ambulatory Visit | Attending: Interventional Radiology | Admitting: Interventional Radiology

## 2018-08-23 DIAGNOSIS — C787 Secondary malignant neoplasm of liver and intrahepatic bile duct: Principal | ICD-10-CM

## 2018-08-23 DIAGNOSIS — C189 Malignant neoplasm of colon, unspecified: Secondary | ICD-10-CM

## 2018-08-23 HISTORY — PX: IR RADIOLOGIST EVAL & MGMT: IMG5224

## 2018-08-23 NOTE — Progress Notes (Signed)
Chief Complaint: Metastatic colorectal carcinoma.  Referring Physician(s): Dr. Humphrey Rolls  History of Present Illness: Andres Chang is a 62 y.o. male presenting as a follow up visit today to [place], SP his recent y90 treatment of the left liver lobe.    He is here today with his wife for our appointment.    Most recently, we treated him with administration of y90 dose to his left liver, which was performed 07/13/2018.  This was performed at [facility].  He was discharged on the same day.  He tells me that he did just fine afterwards, and had no complaints.  He denied any GI symptoms such as N/V/D.  He denied any fatigue or appetite change.  He thinks he did better with this recent treatment than his prior right liver treatment dose which was performed earlier.    Currently, he is feeling quite well.    On the day of our procedure in July, we did a CT abd/pelvis for accurate dose prescription, and this identified suspicious nodules at the lung bases.  These nodules were confirmed on a follow up CT scan chest, which was just completed 08/14/2018.    The tell me that Dr. Humphrey Rolls will be initiating new therapy, but they are unaware of the precise treatment strategy.    He again is very thankful and appreciative of our care, and the treatment he has received from our entire team, with very kind words for everyone.     Past Medical History:  Diagnosis Date  . Cancer ([place])    colon ca mets to liver   . Dysrhythmia    atrial fib   . History of blood transfusion   . History of chemotherapy   . History of GI bleed   . History of kidney stones   . Neuropathy   . Pneumonia    hx of   . Port catheter in place     Past Surgical History:  Procedure Laterality Date  . APPENDECTOMY    . IR RADIOLOGIST EVAL & MGMT  08/23/2017  . IR RADIOLOGIST EVAL & MGMT  11/08/2017  . IR RADIOLOGIST EVAL & MGMT  01/10/2018  . IR RADIOLOGIST EVAL & MGMT  06/01/2018  . IR RADIOLOGIST EVAL & MGMT   08/23/2018  . kidney stent      due to kidney stones   . left fifth finger amputation     . port a cath placement    . RADIOLOGY WITH ANESTHESIA N/A 09/16/2017   Procedure: CT - MICROWAVE ABLATION LIVER;  Surgeon: Corrie Mckusick, DO;  Location: [facility];  Service: Anesthesiology;  Laterality: N/A;  . surgery related to GI bleed       Allergies: Cymbalta [duloxetine hcl]; Penicillins; and Gabapentin  Medications: Prior to Admission medications   Medication Sig Start Date End Date Taking? Authorizing Provider  ALPRAZolam Duanne Moron) 1 MG tablet Take 0.5 mg by mouth at bedtime.     [provider]  b complex vitamins tablet Take 1 tablet by mouth daily.    [provider]  diltiazem (CARDIZEM CD) 120 MG 24 hr capsule Take 120 mg by mouth daily.    [provider]  docusate sodium (COLACE) 100 MG capsule Take 1 capsule (100 mg total) by mouth 2 (two) times daily. 09/17/17   Saverio Danker, PA-C  Emollient (AQUAPHOR EX) Apply 1 application topically daily as needed (for bruised skin.).    [provider]  Iron-FA-B Cmp-C-Biot-Probiotic (FUSION PLUS) CAPS  Take 1 tablet by mouth daily.    [provider]  megestrol (MEGACE) 400 MG/10ML suspension Take 800 mg by mouth daily as needed (for appetite).     [provider]  prochlorperazine (COMPAZINE) 10 MG tablet Take 10 mg by mouth every 4 (four) hours as needed (for nausea/vomiting associated with chemo).     [provider]  rivaroxaban (XARELTO) 20 MG TABS tablet Take 20 mg by mouth daily with breakfast.     [provider]  spironolactone (ALDACTONE) 50 MG tablet Take 50 mg by mouth daily.    [provider]     No family history on file.  Social History   Socioeconomic History  . Marital status: Married    Spouse name: Not on file  . Number of children: Not on file  . Years of education: Not on file  . Highest education level: Not on file  Occupational History  .  Not on file  Social Needs  . Financial resource strain: Not on file  . Food insecurity:    Worry: Not on file    Inability: Not on file  . Transportation needs:    Medical: Not on file    Non-medical: Not on file  Tobacco Use  . Smoking status: Never Smoker  . Smokeless tobacco: Never Used  Substance and Sexual Activity  . Alcohol use: No  . Drug use: No  . Sexual activity: Not on file  Lifestyle  . Physical activity:    Days per week: Not on file    Minutes per session: Not on file  . Stress: Not on file  Relationships  . Social connections:    Talks on phone: Not on file    Gets together: Not on file    Attends religious service: Not on file    Active member of club or organization: Not on file    Attends meetings of clubs or organizations: Not on file    Relationship status: Not on file  Other Topics Concern  . Not on file  Social History Narrative  . Not on file     Review of Systems: A 12 point ROS discussed and pertinent positives are indicated in the HPI above.  All other systems are negative.  Review of Systems  Vital Signs: BP 117/78 (BP Location: Right Arm, Patient Position: Sitting, Cuff Size: Normal)   Pulse 80   Temp 98 F (36.7 C)   Resp 16   SpO2 98%   Physical Exam Targeted exam of the right hip shows well healed puncture site, with no palpable mass.  Mallampati Score:     Imaging: Ir Radiologist Eval & Mgmt  Result Date: 08/23/2018 Please refer to notes tab for details about interventional procedure. (Op Note)   Labs:  CBC: Recent Labs    09/13/17 1332 09/16/17 0706 09/17/17 0338  WBC 6.7 6.7 6.6  HGB 13.7 13.7 11.6*  HCT 39.6 39.6 34.1*  PLT 72* 69* 87*    COAGS: Recent Labs    09/13/17 1332 09/16/17 0706  INR 1.68 1.11    BMP: Recent Labs    09/13/17 1332 09/16/17 0706 09/17/17 0338  NA 141 138 138  K 4.1 3.8 4.3  CL 109 107 108  CO2 24 22 22   GLUCOSE 103* 101* 221*  BUN 11 12 14   CALCIUM 9.7 9.3 9.1    CREATININE 1.01 0.93 0.99  GFRNONAA >60 >60 >60  GFRAA >60 >60 >60    LIVER FUNCTION  TESTS: Recent Labs    09/13/17 1332 09/16/17 0706 09/17/17 0338  BILITOT 1.5* 1.7* 1.9*  AST 50* 47* 211*  ALT 39 41 146*  ALKPHOS 72 71 65  PROT 6.3* 6.2* 5.6*  ALBUMIN 3.6 3.5 3.2*    TUMOR MARKERS: No results for input(s): AFPTM, CEA, CA199, CHROMGRNA in the last 8760 hours.  Assessment and Plan:  Mr Lograsso is a 62 year old male with metastatic colorectal carcinoma, now SP y90 treatment of both the right and left liver lobes.    His most recent left liver treatment was completed on 07/13/2018.  He has done just fine.    He will be initiating new therapy with Dr. Humphrey Rolls for treatment of newly discovered lung nodules, presumably progression.    I did discuss with him our own strategy, which at this time would be surveillance.  Our first scheduled imaging would be about 3 months after his most recent therapy.    He shared some very kind words regarding how appreciative he is of our care, and of our team taking care of him.    Plan: - Follow up office visit in 3 months.  Plan for contrast CT abd/pelvis (liver protocol) in 3 months.   - I have advised him to observe his other follow up appointments.   Electronically Signed: Corrie Mckusick 08/23/2018, 4:40 PM   I spent a total of    25 Minutes in face to face in clinical consultation, greater than 50% of which was counseling/coordinating care for metastatic colorectal carcinoma, SP y90 therapy, bilateral liver lobes.

## 2018-12-05 ENCOUNTER — Other Ambulatory Visit: Payer: Self-pay | Admitting: Interventional Radiology

## 2018-12-05 DIAGNOSIS — C787 Secondary malignant neoplasm of liver and intrahepatic bile duct: Secondary | ICD-10-CM

## 2018-12-28 ENCOUNTER — Ambulatory Visit
Admission: RE | Admit: 2018-12-28 | Discharge: 2018-12-28 | Disposition: A | Discharge status: Home / Self Care | Payer: Medicare HMO | Source: Ambulatory Visit | Attending: Interventional Radiology | Admitting: Interventional Radiology

## 2018-12-28 ENCOUNTER — Encounter: Payer: Self-pay | Admitting: Radiology

## 2018-12-28 DIAGNOSIS — C787 Secondary malignant neoplasm of liver and intrahepatic bile duct: Secondary | ICD-10-CM

## 2018-12-28 HISTORY — PX: IR RADIOLOGIST EVAL & MGMT: IMG5224

## 2018-12-28 NOTE — Progress Notes (Signed)
Chief Complaint: Hx of metastatic colorectal CA.  Referring Physician(s): Oncology: Dr. Humphrey Rolls  History of Present Illness: Andres Chang is a 63 y.o. male presenting as a scheduled office visit today to [facility], with diagnosis of metastatic CRCA, undergoing current treatment.   History: I first met Andres Chang at [facility] 08/27/2015, when he presenting with life-threatening lower GI bleeding secondary to rectal mass.  Embolization was performed at that time.  The diagnosis of CRCA was made at that time, with multiple liver lesions.  He started therapy, without surgical resection.    In September of 2018 he was referred back for treatment of a persisting liver lesion, and he underwent CT guided tissue ablation of segment 7/8 lesion 09/16/2017.    He again had progression in early 2019, and we proceeded with bilobar treatment with y90, first with the right 05/08/2018, and then to the left 07/13/2018.  He tolerated these treatments well.    We last saw Andres Chang in our clinic 08/23/2018.    Today: He returns today with his wife for our visit.  He has had CT performed at [facility] 12/12/2018, and he had his most recent appointment with Dr. Humphrey Rolls 12/25/2018.  He seems happy with his status right now, and continues to receive chemotherapy, which they tell me is Irinotecan.  This occurs every 2-3 weeks depending on labs.  He has some fatigue and nausea with the dose, but he tells me he feels this agent is better than prior agents. His appetite is good and he has no ongoing symptoms.  He is up and out of bed all day, with occasion fatigue.  Overall I would say he has ECOG of 0.  I reviewed his CT scan, which shows decreasing size of the liver lesions, and no evidence of progression.  He is happy with the news and thankful.    Today they have plans to watch their grandson, 72 year old.  He is pleased.      Past Medical History:  Diagnosis Date  . Cancer ([place])    colon ca mets to liver   . Dysrhythmia    atrial fib   . History of blood transfusion   . History of chemotherapy   . History of GI bleed   . History of kidney stones   . Neuropathy   . Pneumonia    hx of   . Port catheter in place     Past Surgical History:  Procedure Laterality Date  . APPENDECTOMY    . IR RADIOLOGIST EVAL & MGMT  08/23/2017  . IR RADIOLOGIST EVAL & MGMT  11/08/2017  . IR RADIOLOGIST EVAL & MGMT  01/10/2018  . IR RADIOLOGIST EVAL & MGMT  06/01/2018  . IR RADIOLOGIST EVAL & MGMT  08/23/2018  . kidney stent      due to kidney stones   . left fifth finger amputation     . port a cath placement    . RADIOLOGY WITH ANESTHESIA N/A 09/16/2017   Procedure: CT - MICROWAVE ABLATION LIVER;  Surgeon: Corrie Mckusick, DO;  Location: [facility];  Service: Anesthesiology;  Laterality: N/A;  . surgery related to GI bleed       Allergies: Cymbalta [duloxetine hcl]; Penicillins; and Gabapentin  Medications: Prior to Admission medications   Medication Sig Start Date End Date Taking? Authorizing Provider  ALPRAZolam Duanne Moron) 1 MG tablet Take 0.5 mg by mouth at bedtime.    Yes [provider]  b complex vitamins  tablet Take 1 tablet by mouth daily.   Yes [provider]  diltiazem (CARDIZEM CD) 120 MG 24 hr capsule Take 120 mg by mouth daily.   Yes [provider]  docusate sodium (COLACE) 100 MG capsule Take 1 capsule (100 mg total) by mouth 2 (two) times daily. 09/17/17  Yes Saverio Danker, PA-C  Emollient (AQUAPHOR EX) Apply 1 application topically daily as needed (for bruised skin.).   Yes [provider]  Iron-FA-B Cmp-C-Biot-Probiotic (FUSION PLUS) CAPS Take 1 tablet by mouth daily.   Yes [provider]  megestrol (MEGACE) 400 MG/10ML suspension Take 800 mg by mouth daily as needed (for appetite).    Yes [provider]  prochlorperazine (COMPAZINE) 10 MG tablet Take 10 mg by mouth every 4 (four) hours as needed (for nausea/vomiting associated with chemo).    Yes [provider]  spironolactone (ALDACTONE) 50 MG tablet Take 50 mg by mouth daily.   Yes [provider]     No family history on file.  Social History   Socioeconomic History  . Marital status: Married    Spouse name: Not on file  . Number of children: Not on file  . Years of education: Not on file  . Highest education level: Not on file  Occupational History  . Not on file  Social Needs  . Financial resource strain: Not on file  . Food insecurity:    Worry: Not on file    Inability: Not on file  . Transportation needs:    Medical: Not on file    Non-medical: Not on file  Tobacco Use  . Smoking status: Never Smoker  . Smokeless tobacco: Never Used  Substance and Sexual Activity  . Alcohol use: No  . Drug use: No  . Sexual activity: Not on file  Lifestyle  . Physical activity:    Days per week: Not on file    Minutes per session: Not on file  . Stress: Not on file  Relationships  . Social connections:    Talks on phone: Not on file    Gets together: Not on file    Attends religious service: Not on file    Active member of club or organization: Not on file    Attends meetings of clubs or organizations: Not on file    Relationship status: Not on file  Other Topics Concern  . Not on file  Social History Narrative  . Not on file    ECOG Status: 0 - Asymptomatic  Review of Systems: A 12 point ROS discussed and pertinent positives are indicated in the HPI above.  All other systems are negative.  Review of Systems  Vital Signs: BP 119/65   Pulse (!) 54   Temp 98.1 F (36.7 C) (Oral)   Ht 5' 10"  (1.778 m)   Wt 94.3 kg   SpO2 95%   BMI 29.84 kg/m   Physical Exam General: 63 yo male appearing stated age.  Well-developed, well-nourished.  No distress. HEENT: Atraumatic, normocephalic. Glasses.  Conjugate gaze, extra-ocular motor intact. No scleral icterus or scleral injection. No lesions on external ears, nose, lips, or gums.  Oral mucosa moist,  pink.  Neck: Symmetric with no goiter enlargement.  Chest/Lungs:  Symmetric chest with inspiration/expiration.  No labored breathing.    Heart:    No JVD appreciated.  Abdomen:  NT/ND.   Genito-urinary: Deferred Neurologic: Alert & Oriented to person, place, and time.   Normal affect and insight.  Appropriate questions.  Moving all 4 extremities with gross sensory intact.   Imaging: No results found.  Labs:  CBC: No results for input(s): WBC, HGB, HCT, PLT in the last 8760 hours.  COAGS: No results for input(s): INR, APTT in the last 8760 hours.  BMP: No results for input(s): NA, K, CL, CO2, GLUCOSE, BUN, CALCIUM, CREATININE, GFRNONAA, GFRAA in the last 8760 hours.  Invalid input(s): CMP  LIVER FUNCTION TESTS: No results for input(s): BILITOT, AST, ALT, ALKPHOS, PROT, ALBUMIN in the last 8760 hours.  TUMOR MARKERS: No results for input(s): AFPTM, CEA, CA199, CHROMGRNA in the last 8760 hours.  Assessment and Plan:   Andres Chang is a 63 year old male with a history of metastatic colorectal carcinoma, currently receiving chemotherapy and previously undergoing bilateral liver lobar y90 treatment in May and July of 2019.    His most recent CT imaging from 12/12/2018 shows good response, with decreasing size of liver lesions and new evidence of progression.    At this point, we will use surveillance strategy, as he will continue to receive therapy from Dr. Humphrey Rolls at [facility].    We discussed the timing of his next CT, which we would recommend in 4 - 6 months.  I do not have his most recent CEA labs available, but we would suggest using the trend to determine the next imaging.  If there were an increase in his CEA imaging could occur sooner.  If stable, a 6 month follow up would be reasonable.     Electronically Signed: Corrie Mckusick 12/28/2018, 9:26 AM   I spent a total of    25 Minutes in face to face in clinical consultation, greater than 50% of which was counseling/coordinating  care for metastatic colorectal carcinoma, SP CT ablation of right liver lesion, SP bilateral lobar y90 therapy.

## 2019-12-21 DEATH — deceased
